# Patient Record
Sex: Male | Born: 1970 | Race: Black or African American | Hispanic: No | Smoking: Never smoker
Health system: Southern US, Community
[De-identification: ages and names within clinical notes are randomized; demographics above are authoritative.]

## PROBLEM LIST (undated history)

## (undated) DIAGNOSIS — I10 Essential (primary) hypertension: Secondary | ICD-10-CM

## (undated) HISTORY — PX: CORONARY STENT PLACEMENT: SHX1402

---

## 2004-07-17 ENCOUNTER — Emergency Department (HOSPITAL_COMMUNITY): Admission: EM | Admit: 2004-07-17 | Discharge: 2004-07-17 | Payer: Self-pay | Admitting: Family Medicine

## 2008-08-17 ENCOUNTER — Emergency Department (HOSPITAL_COMMUNITY): Admission: EM | Admit: 2008-08-17 | Discharge: 2008-08-17 | Payer: Self-pay | Admitting: Family Medicine

## 2009-12-22 ENCOUNTER — Emergency Department (HOSPITAL_COMMUNITY): Admission: EM | Admit: 2009-12-22 | Discharge: 2009-12-22 | Payer: Self-pay | Admitting: Emergency Medicine

## 2010-06-06 ENCOUNTER — Emergency Department (HOSPITAL_COMMUNITY): Admission: EM | Admit: 2010-06-06 | Discharge: 2010-06-06 | Payer: Self-pay | Admitting: Family Medicine

## 2010-11-06 LAB — POCT I-STAT, CHEM 8
HCT: 45 % (ref 39.0–52.0)
Hemoglobin: 15.3 g/dL (ref 13.0–17.0)
Potassium: 3.9 mEq/L (ref 3.5–5.1)
Sodium: 143 mEq/L (ref 135–145)
TCO2: 30 mmol/L (ref 0–100)

## 2011-01-07 ENCOUNTER — Inpatient Hospital Stay (INDEPENDENT_AMBULATORY_CARE_PROVIDER_SITE_OTHER)
Admission: RE | Admit: 2011-01-07 | Discharge: 2011-01-07 | Disposition: A | Discharge status: Home / Self Care | Payer: Self-pay | Source: Ambulatory Visit | Attending: Family Medicine | Admitting: Family Medicine

## 2011-01-07 ENCOUNTER — Ambulatory Visit (INDEPENDENT_AMBULATORY_CARE_PROVIDER_SITE_OTHER): Payer: Self-pay

## 2011-01-07 DIAGNOSIS — R109 Unspecified abdominal pain: Secondary | ICD-10-CM

## 2011-01-07 DIAGNOSIS — I10 Essential (primary) hypertension: Secondary | ICD-10-CM

## 2011-01-07 LAB — POCT I-STAT, CHEM 8
Glucose, Bld: 89 mg/dL (ref 70–99)
HCT: 42 % (ref 39.0–52.0)
Hemoglobin: 14.3 g/dL (ref 13.0–17.0)
Potassium: 4.1 mEq/L (ref 3.5–5.1)
Sodium: 142 mEq/L (ref 135–145)

## 2011-01-07 LAB — CBC
Hemoglobin: 13 g/dL (ref 13.0–17.0)
MCHC: 33.2 g/dL (ref 30.0–36.0)
WBC: 9.2 10*3/uL (ref 4.0–10.5)

## 2011-07-31 ENCOUNTER — Other Ambulatory Visit: Payer: Self-pay

## 2011-07-31 ENCOUNTER — Emergency Department (HOSPITAL_COMMUNITY)
Admission: EM | Admit: 2011-07-31 | Discharge: 2011-07-31 | Disposition: A | Discharge status: Home / Self Care | Payer: Self-pay | Source: Home / Self Care | Attending: Emergency Medicine | Admitting: Emergency Medicine

## 2011-07-31 ENCOUNTER — Emergency Department (INDEPENDENT_AMBULATORY_CARE_PROVIDER_SITE_OTHER): Payer: Self-pay

## 2011-07-31 DIAGNOSIS — R6889 Other general symptoms and signs: Secondary | ICD-10-CM

## 2011-07-31 HISTORY — DX: Essential (primary) hypertension: I10

## 2011-07-31 LAB — CBC
Hemoglobin: 13.2 g/dL (ref 13.0–17.0)
MCV: 88.5 fL (ref 78.0–100.0)
Platelets: 350 10*3/uL (ref 150–400)
RBC: 4.42 MIL/uL (ref 4.22–5.81)
WBC: 11.4 10*3/uL — ABNORMAL HIGH (ref 4.0–10.5)

## 2011-07-31 LAB — DIFFERENTIAL
Eosinophils Relative: 0 % (ref 0–5)
Lymphocytes Relative: 8 % — ABNORMAL LOW (ref 12–46)
Lymphs Abs: 0.9 10*3/uL (ref 0.7–4.0)
Neutrophils Relative %: 85 % — ABNORMAL HIGH (ref 43–77)

## 2011-07-31 MED ORDER — IBUPROFEN 800 MG PO TABS
800.0000 mg | ORAL_TABLET | Freq: Once | ORAL | Status: AC
  Administered 2011-07-31: 800 mg via ORAL

## 2011-07-31 MED ORDER — CLONIDINE HCL 0.1 MG PO TABS
ORAL_TABLET | ORAL | Status: AC
  Filled 2011-07-31: qty 2

## 2011-07-31 MED ORDER — ACETAMINOPHEN 325 MG PO TABS
ORAL_TABLET | ORAL | Status: AC
  Filled 2011-07-31: qty 3

## 2011-07-31 MED ORDER — CLONIDINE HCL 0.1 MG PO TABS
0.2000 mg | ORAL_TABLET | Freq: Once | ORAL | Status: AC
  Administered 2011-07-31: 0.2 mg via ORAL

## 2011-07-31 MED ORDER — IBUPROFEN 800 MG PO TABS
ORAL_TABLET | ORAL | Status: AC
  Filled 2011-07-31: qty 1

## 2011-07-31 MED ORDER — ACETAMINOPHEN 325 MG PO TABS
975.0000 mg | ORAL_TABLET | Freq: Once | ORAL | Status: AC
  Administered 2011-07-31: 975 mg via ORAL

## 2011-07-31 MED ORDER — OSELTAMIVIR PHOSPHATE 75 MG PO CAPS: 75.0000 mg | ORAL_CAPSULE | Freq: Two times a day (BID) | ORAL | Status: AC

## 2011-07-31 MED ORDER — ACETAMINOPHEN-CODEINE #3 300-30 MG PO TABS: 1.0000 | ORAL_TABLET | Freq: Four times a day (QID) | ORAL | Status: AC | PRN

## 2011-07-31 NOTE — ED Notes (Signed)
C/o HA, chills, fever, light sensitivity , generalized body aches; LD of medication was OTC theraflu ~ 4:30-5 am

## 2011-07-31 NOTE — ED Provider Notes (Signed)
History     CSN: [id number] Arrival date & time: 07/31/2011  3:54 PM   First MD Initiated Contact with Patient 07/31/11 [id number]      Chief Complaint  Patient presents with  . Dizziness    (Consider location/radiation/quality/duration/timing/severity/associated sxs/prior treatment) HPI Comments: Started last night with a splitting HA, and feeling dizzy, generalized body aches and cough, some congestion, the headache its not getting better" NO SOB No paresthesias No weakness No numbness  Patient is a 40 y.o. male presenting with fever. The history is provided by the patient.  Fever Primary symptoms of the febrile illness include fever, headaches, cough and arthralgias. Primary symptoms do not include nausea or vomiting. The current episode started yesterday. This is a new problem. The problem has been gradually worsening.    Past Medical History  Diagnosis Date  . Hypertension     History reviewed. No pertinent past surgical history.  History reviewed. No pertinent family history.  History  Substance Use Topics  . Smoking status: Never Smoker   . Smokeless tobacco: Not on file  . Alcohol Use: Yes      Review of Systems  Constitutional: Positive for fever.  HENT: Positive for postnasal drip and sinus pressure.   Respiratory: Positive for cough.   Gastrointestinal: Negative for nausea and vomiting.  Musculoskeletal: Positive for arthralgias.  Neurological: Positive for headaches. Negative for numbness.    Allergies  Review of patient's allergies indicates no known allergies.  Home Medications   Current Outpatient Rx  Name Route Sig Dispense Refill  . ACETAMINOPHEN-CODEINE #3 300-30 MG PO TABS Oral Take 1-2 tablets by mouth every 6 (six) hours as needed for pain. 15 tablet 0  . OSELTAMIVIR PHOSPHATE 75 MG PO CAPS Oral Take 1 capsule (75 mg total) by mouth every 12 (twelve) hours. 10 capsule 0  . OSELTAMIVIR PHOSPHATE 75 MG PO CAPS Oral Take 1 capsule (75 mg total)  by mouth every 12 (twelve) hours. 10 capsule 0    BP 176/88  Pulse 111  Temp(Src) 100.2 F (37.9 C) (Oral)  Resp 18  SpO2 97%  Physical Exam  Nursing note and vitals reviewed. Constitutional: He is oriented to person, place, and time. He appears well-developed.  Non-toxic appearance. No distress.  HENT:  Right Ear: Tympanic membrane normal.  Left Ear: Tympanic membrane normal.  Nose: Rhinorrhea present.  Mouth/Throat: Uvula is midline and mucous membranes are normal. Posterior oropharyngeal erythema present. No oropharyngeal exudate or tonsillar abscesses.  Neck: Normal range of motion. Neck supple. No JVD present. No tracheal tenderness present. Normal range of motion present. No Kernig's sign noted. No thyromegaly present.  Cardiovascular: Normal heart sounds and normal pulses.  Tachycardia present.   Pulmonary/Chest: Effort normal and breath sounds normal. No respiratory distress. He has no decreased breath sounds. He has no wheezes. He has no rhonchi. He has no rales.  Abdominal: Normal appearance. There is no tenderness.  Musculoskeletal: Normal range of motion.  Neurological: He is alert and oriented to person, place, and time. He has normal strength. No cranial nerve deficit or sensory deficit. Coordination normal.  Skin: Skin is warm.    ED Course  Procedures (including critical care time)  Labs Reviewed  CBC - Abnormal; Notable for the following:    WBC 11.4 (*)    All other components within normal limits  DIFFERENTIAL - Abnormal; Notable for the following:    Neutrophils Relative 85 (*)    Neutro Abs 9.7 (*)    Lymphocytes Relative  8 (*)    All other components within normal limits   Dg Chest 2 View  07/31/2011  *RADIOLOGY REPORT*  Clinical Data: Cough and fever.  CHEST - 2 VIEW  Comparison: Chest x-ray 08/17/2008.  Findings: The cardiac silhouette, mediastinal and hilar contours are within normal limits and stable.   There is mild peribronchial thickening and  streaky areas of atelectasis suggesting bronchitis. No definite infiltrates or effusions.  The bony thorax is intact.  IMPRESSION: Findings suggest bronchitis.  No definite infiltrates.  Original Report Authenticated By: P. Loralie Champagne, M.D.     1. Influenza-like illness       MDM  URI cough and chills- fever. Hypertensive- mild leukocytosis. X-rays consistent with a respiratory viral syndrome.        Jimmie Molly, MD 07/31/11 2128

## 2012-06-07 IMAGING — CR DG ABDOMEN 1V
3 series · 3 of 3 positions shown · non-contrast
Comparison: None.

CLINICAL DATA: Epigastric abdominal pain.  Nausea.

ABDOMEN - 1 VIEW 01/07/2011:

[view not recorded (1 of 3)]
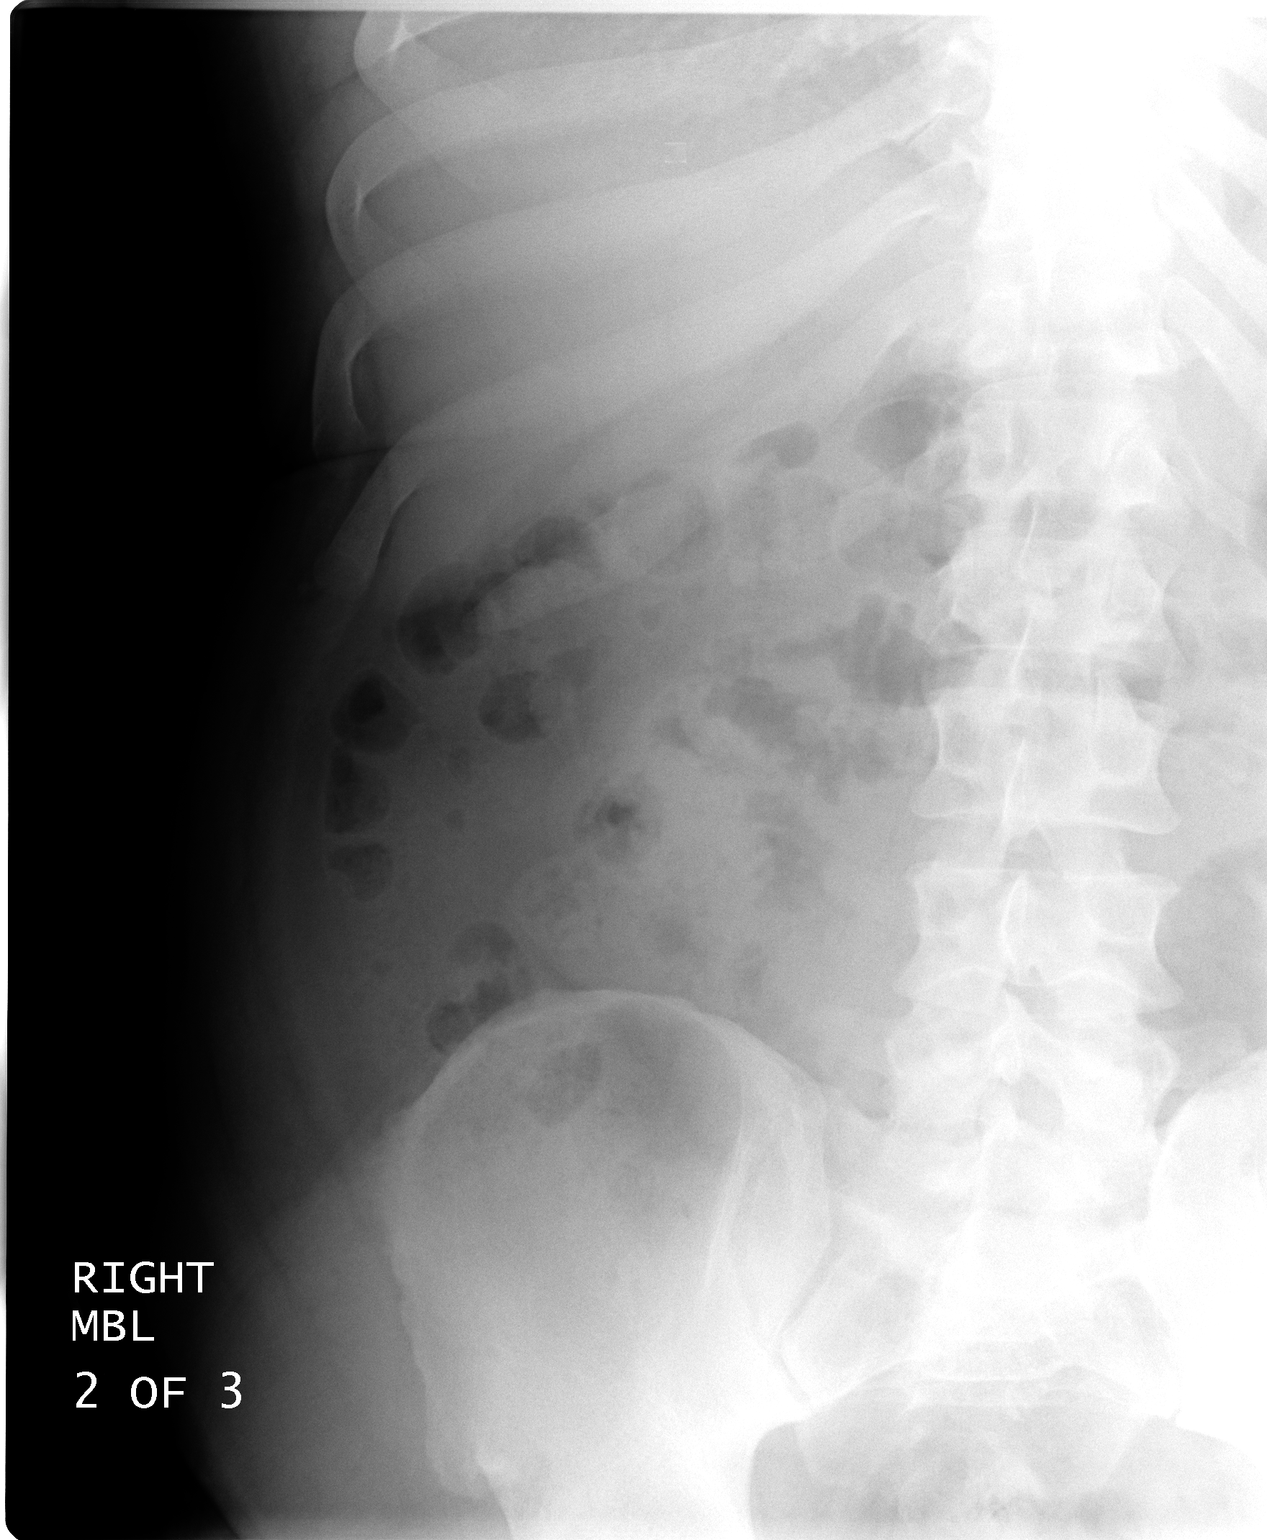

[view not recorded (2 of 3)]
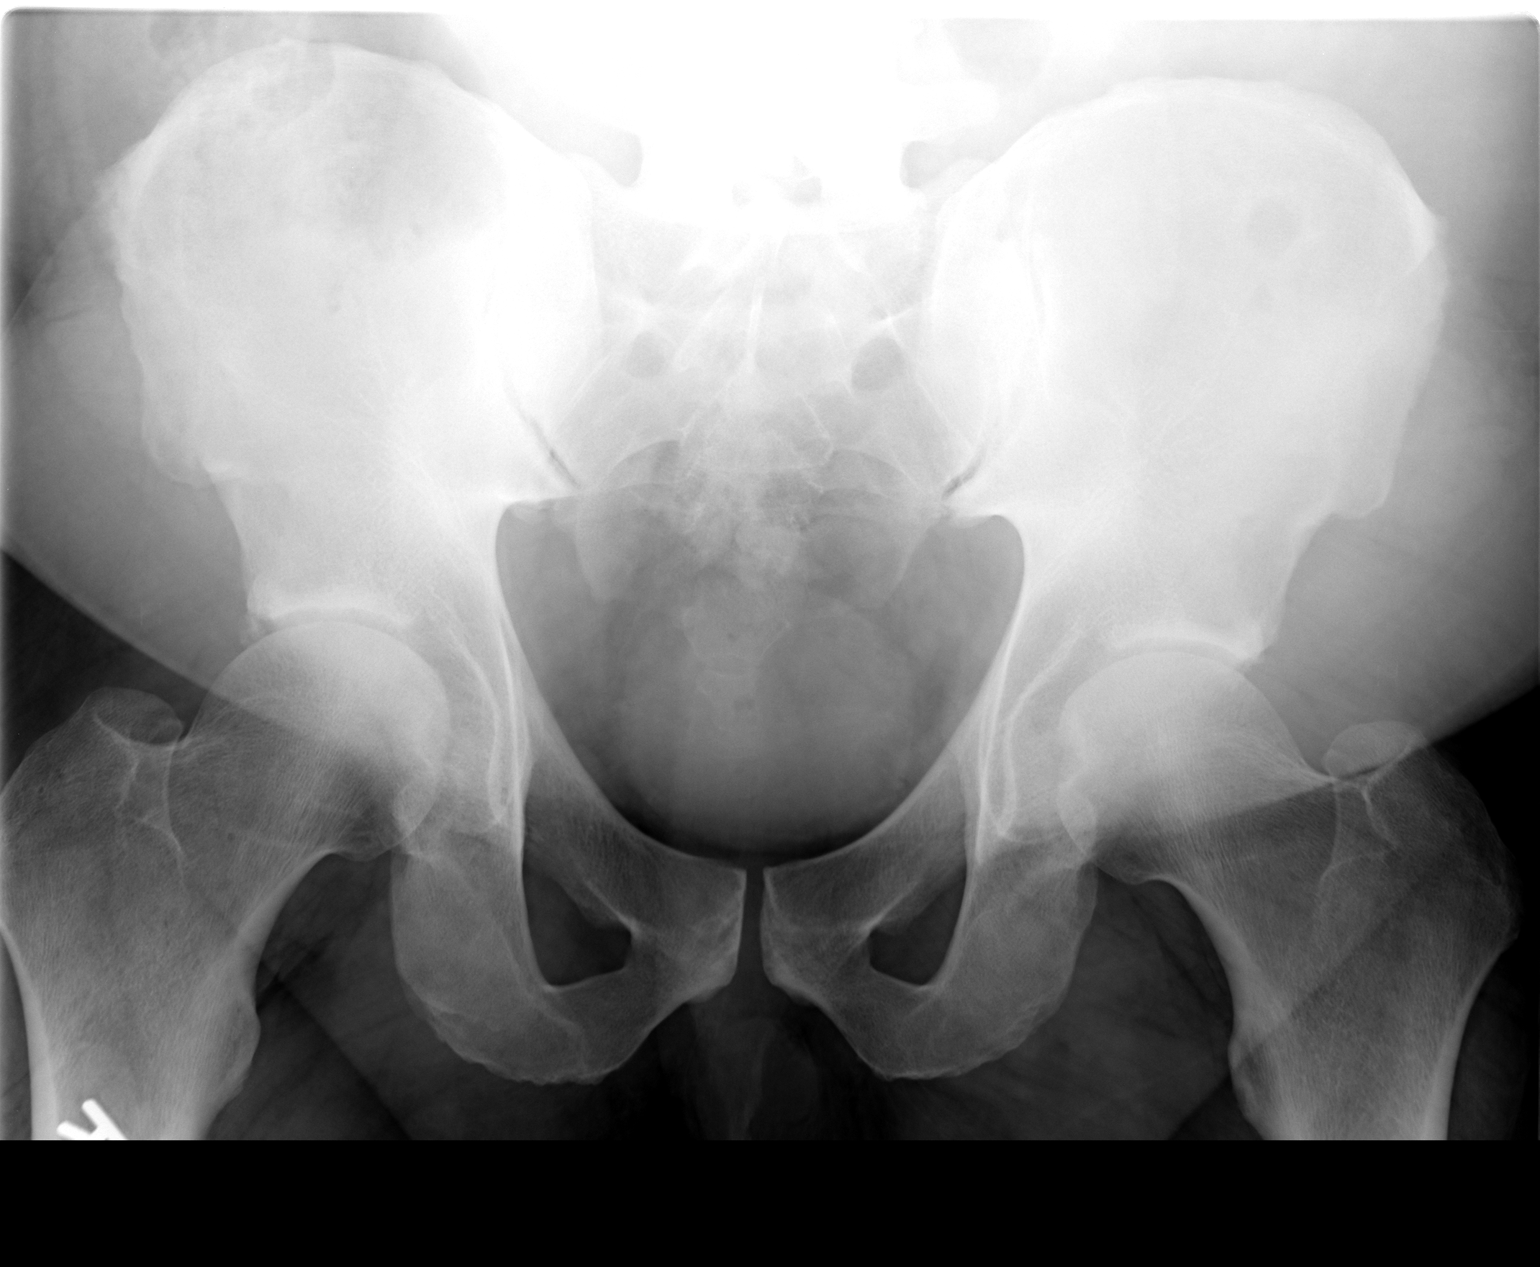

[view not recorded (3 of 3)]
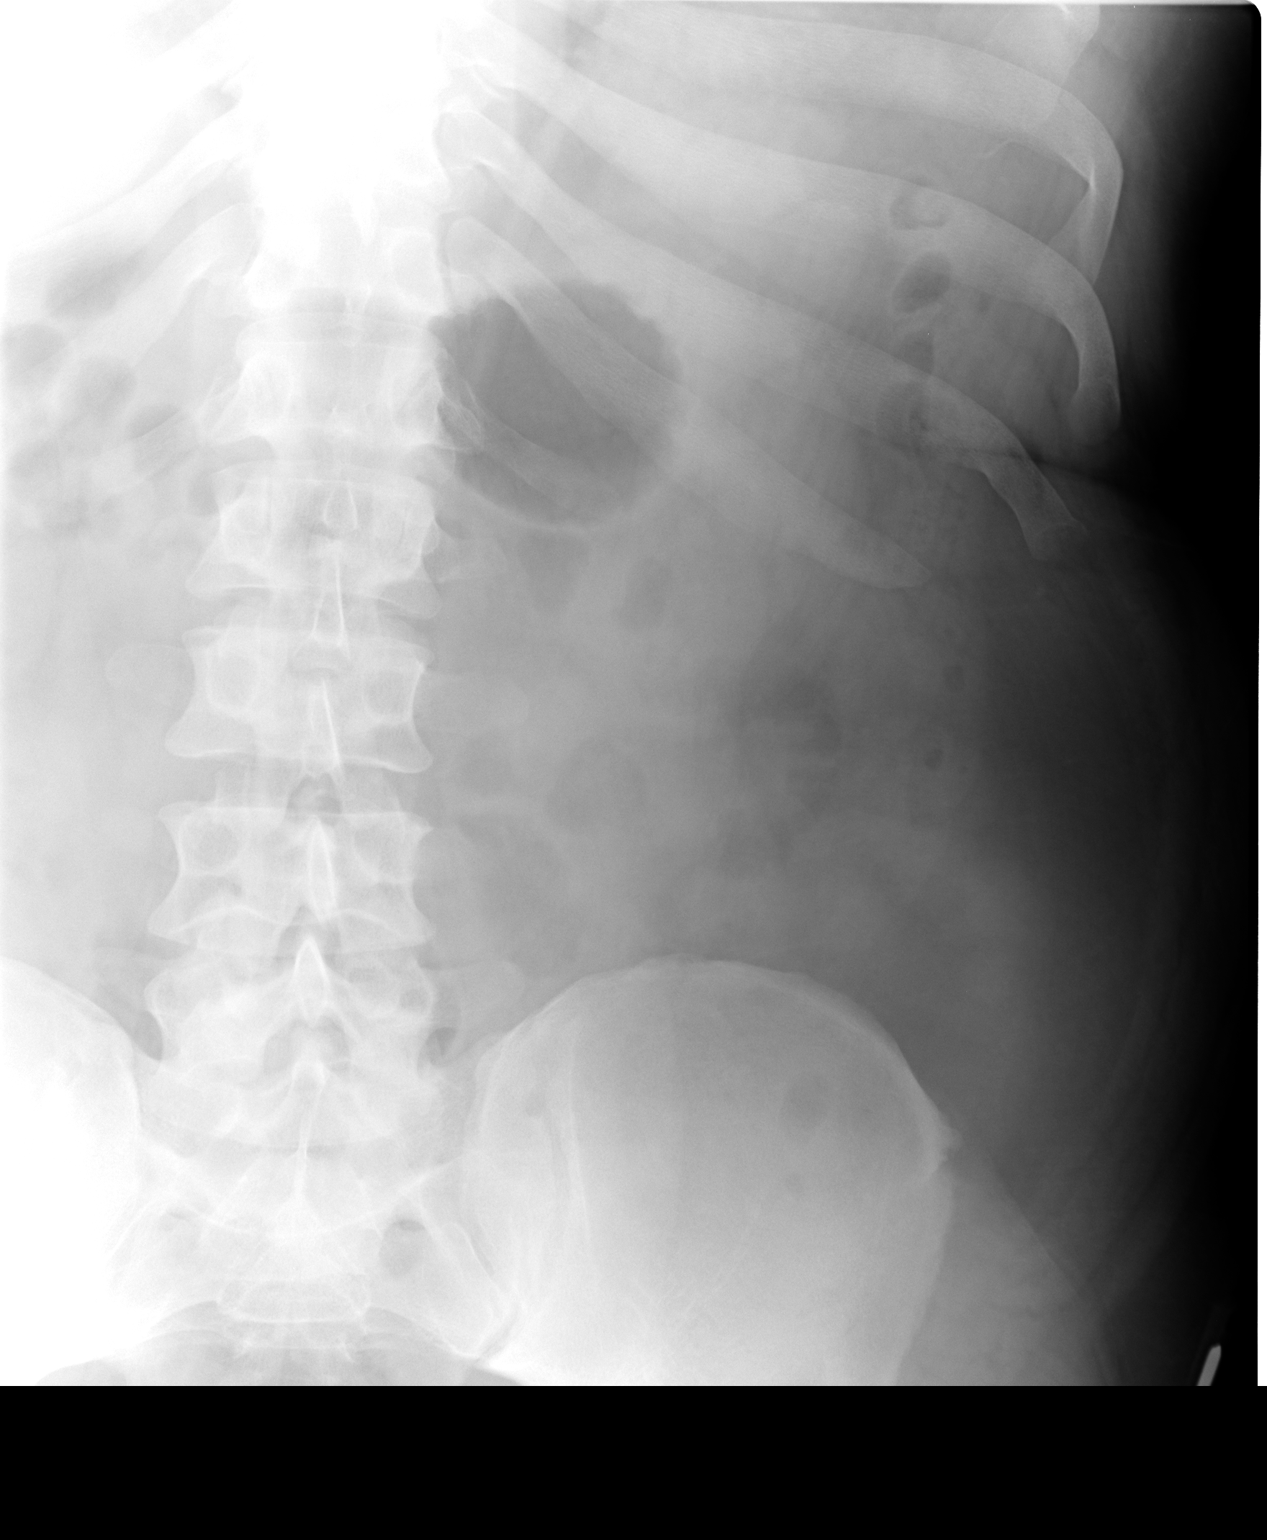

[3 of 3 positions shown; findings below may reference images not displayed]

FINDINGS: Bowel gas pattern unremarkable without evidence of
obstruction or significant ileus.  Gas within normal caliber small
bowel loops.  Gas and expected amount of stool throughout normal
caliber colon.  No abnormal calcifications.  Regional skeleton
unremarkable.
IMPRESSION: No acute abdominal abnormality.

## 2012-12-29 IMAGING — CR DG CHEST 2V
2 series · 2 of 2 positions shown · non-contrast
Comparison: Chest x-ray 08/17/2008.

CLINICAL DATA: Cough and fever.

CHEST - 2 VIEW

[view not recorded (1 of 2)]
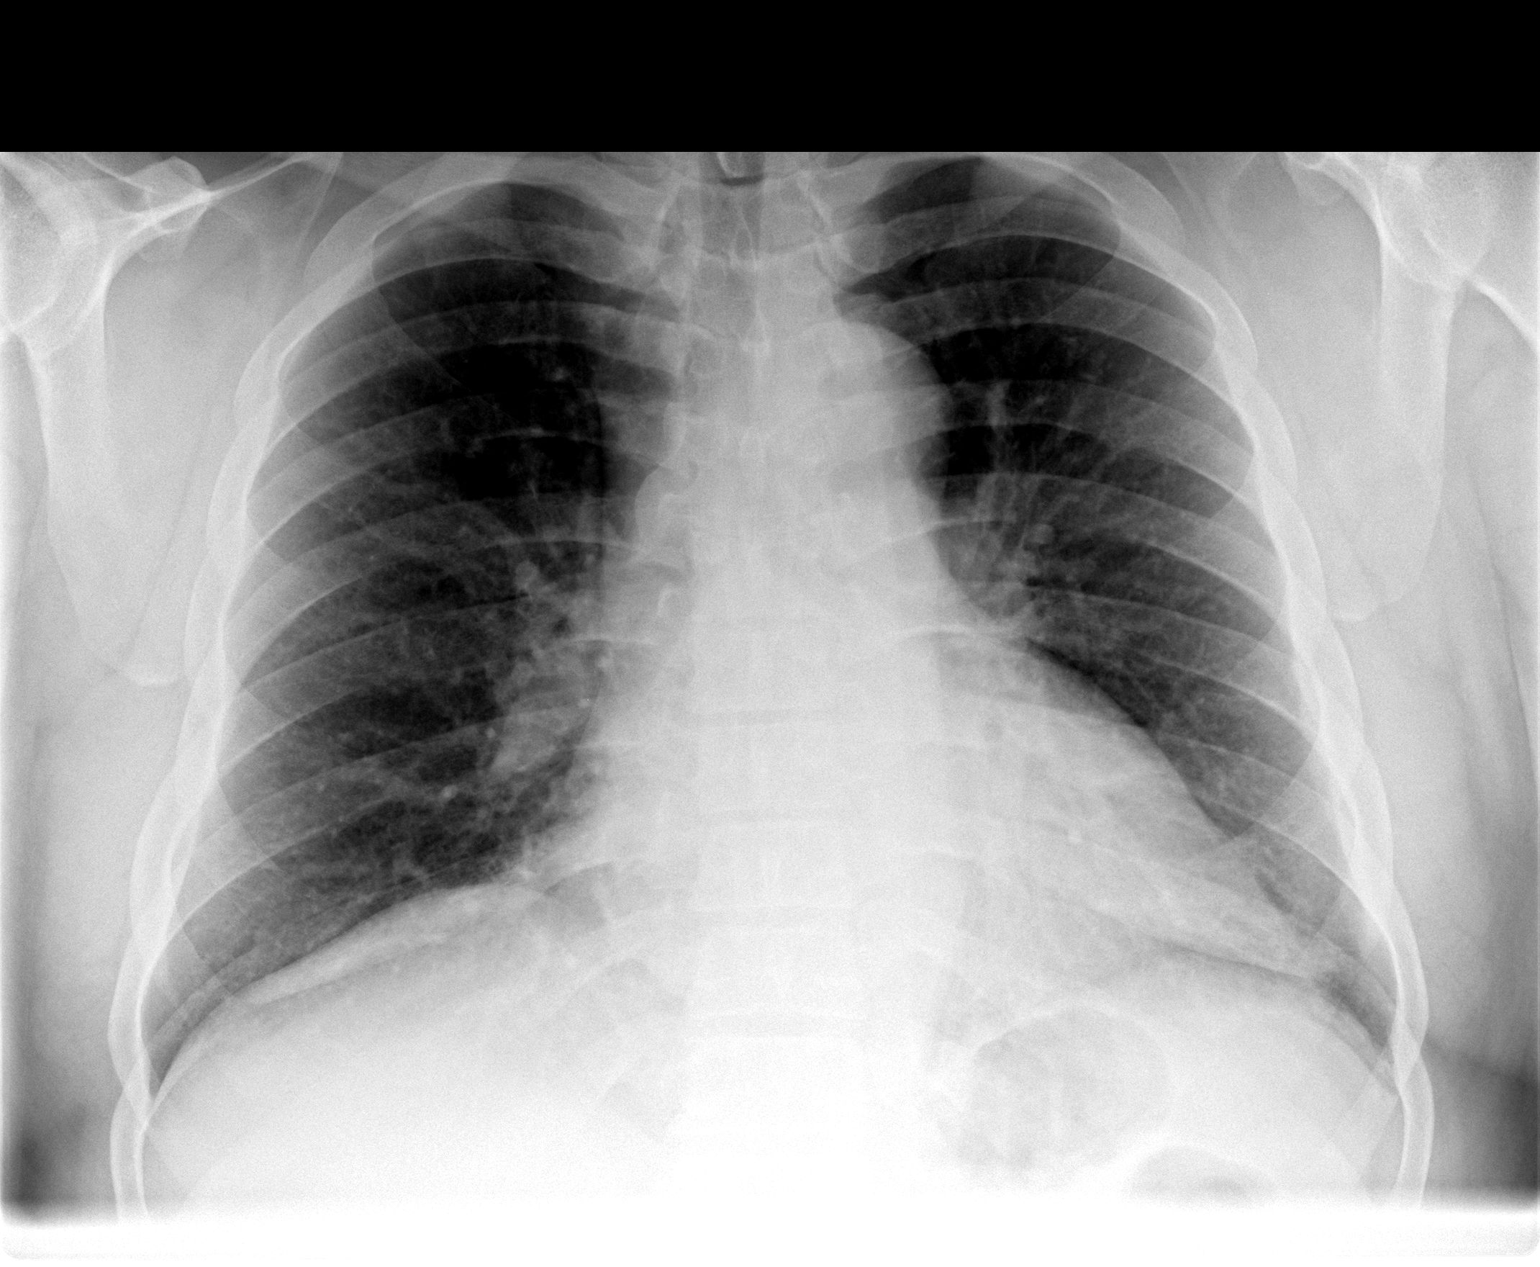

[view not recorded (2 of 2)]
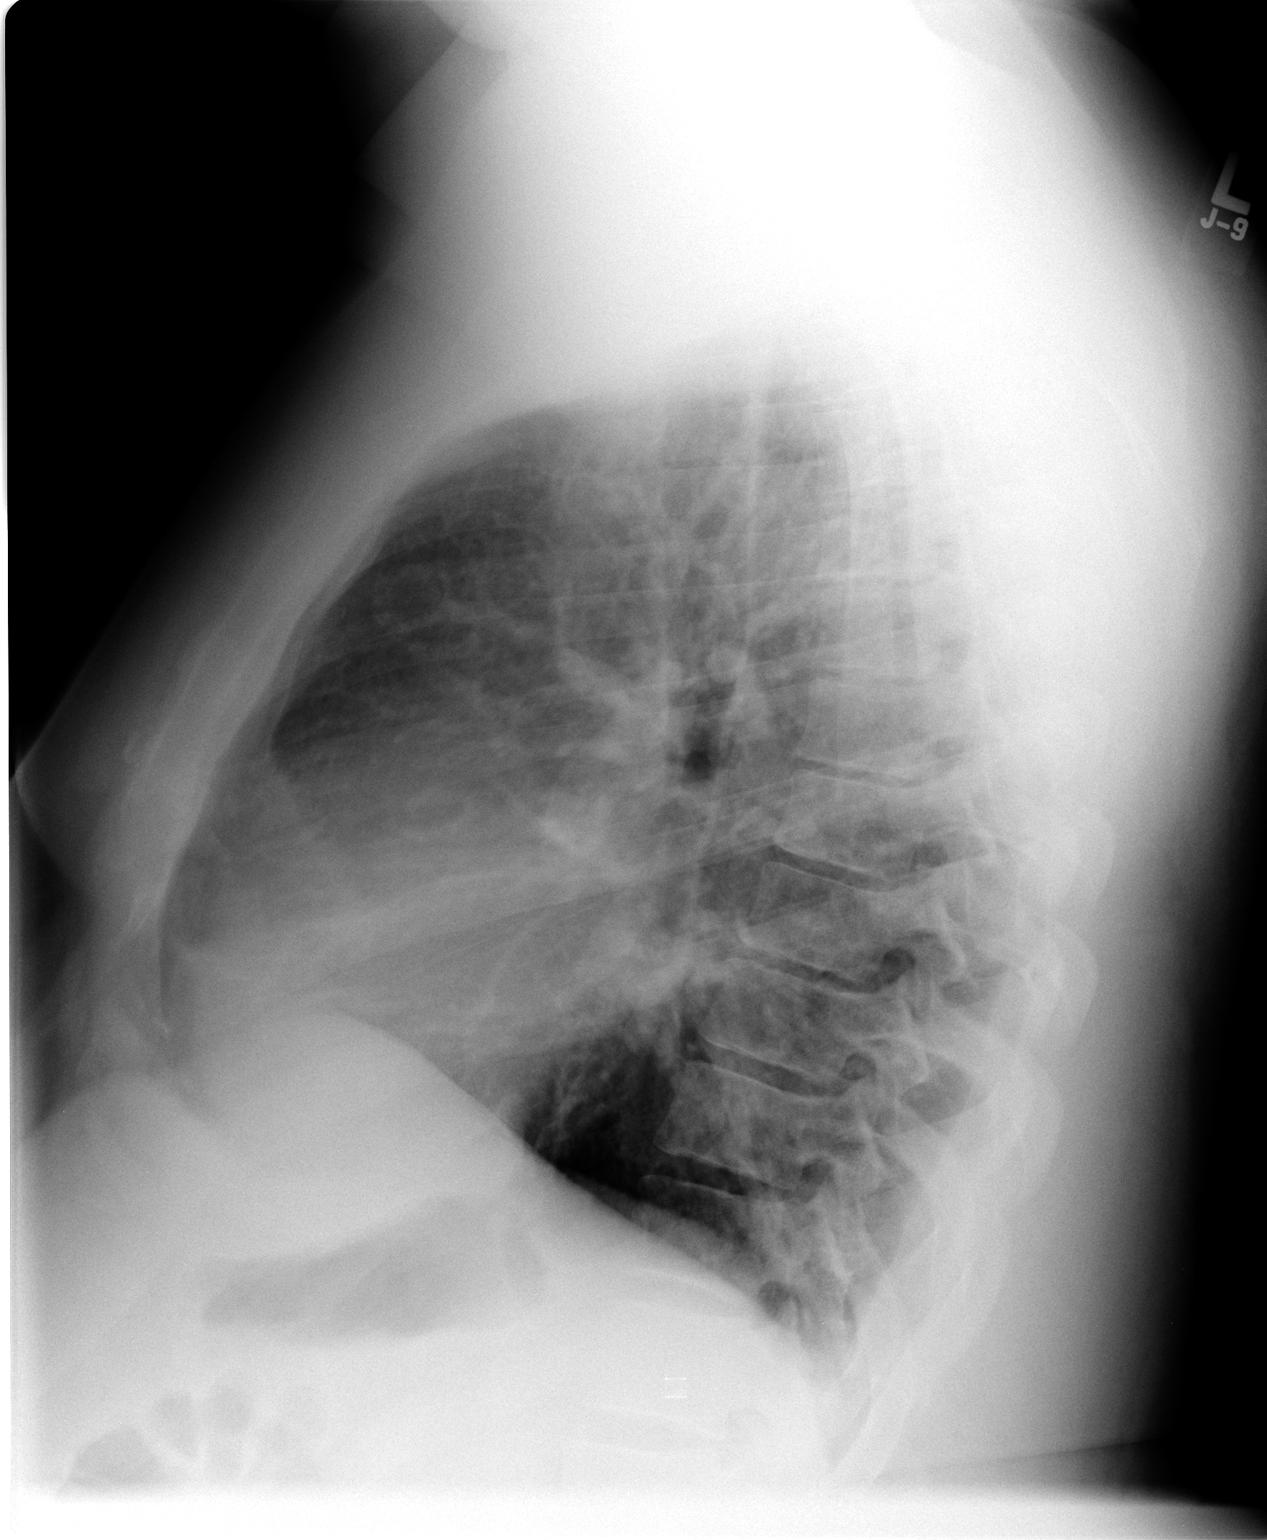

[2 of 2 positions shown; findings below may reference images not displayed]

FINDINGS: The cardiac silhouette, mediastinal and hilar contours
are within normal limits and stable.   There is mild peribronchial
thickening and streaky areas of atelectasis suggesting bronchitis.
No definite infiltrates or effusions.  The bony thorax is intact.
IMPRESSION: Findings suggest bronchitis.  No definite infiltrates.

## 2013-11-03 ENCOUNTER — Ambulatory Visit: Payer: Self-pay | Admitting: Physician Assistant

## 2014-02-01 ENCOUNTER — Other Ambulatory Visit: Payer: Self-pay | Admitting: Family Medicine

## 2014-02-01 DIAGNOSIS — M542 Cervicalgia: Secondary | ICD-10-CM

## 2014-02-06 ENCOUNTER — Other Ambulatory Visit: Payer: Self-pay

## 2014-02-06 ENCOUNTER — Ambulatory Visit
Admission: RE | Admit: 2014-02-06 | Discharge: 2014-02-06 | Disposition: A | Discharge status: Home / Self Care | Payer: 59 | Source: Ambulatory Visit | Attending: Family Medicine | Admitting: Family Medicine

## 2014-02-06 DIAGNOSIS — M542 Cervicalgia: Secondary | ICD-10-CM

## 2016-07-17 DIAGNOSIS — I214 Non-ST elevation (NSTEMI) myocardial infarction: Secondary | ICD-10-CM | POA: Insufficient documentation

## 2017-06-10 DIAGNOSIS — I5022 Chronic systolic (congestive) heart failure: Secondary | ICD-10-CM | POA: Insufficient documentation

## 2017-06-10 DIAGNOSIS — E785 Hyperlipidemia, unspecified: Secondary | ICD-10-CM | POA: Insufficient documentation

## 2017-06-10 DIAGNOSIS — I255 Ischemic cardiomyopathy: Secondary | ICD-10-CM | POA: Insufficient documentation

## 2017-06-10 DIAGNOSIS — I420 Dilated cardiomyopathy: Secondary | ICD-10-CM | POA: Insufficient documentation

## 2020-05-05 ENCOUNTER — Other Ambulatory Visit: Payer: Self-pay

## 2020-05-05 ENCOUNTER — Emergency Department (HOSPITAL_BASED_OUTPATIENT_CLINIC_OR_DEPARTMENT_OTHER)
Admission: EM | Admit: 2020-05-05 | Discharge: 2020-05-05 | Disposition: A | Discharge status: Home / Self Care | Payer: BC Managed Care – PPO

## 2021-01-04 ENCOUNTER — Other Ambulatory Visit: Payer: Self-pay

## 2021-01-04 ENCOUNTER — Encounter (HOSPITAL_COMMUNITY): Payer: Self-pay | Admitting: Pharmacy Technician

## 2021-01-04 ENCOUNTER — Inpatient Hospital Stay (HOSPITAL_COMMUNITY)
Admission: EM | Admit: 2021-01-04 | Discharge: 2021-01-08 | DRG: 291 | Disposition: A | Discharge status: Home / Self Care | Payer: BC Managed Care – PPO | Attending: Internal Medicine | Admitting: Internal Medicine

## 2021-01-04 ENCOUNTER — Emergency Department (HOSPITAL_COMMUNITY): Payer: BC Managed Care – PPO

## 2021-01-04 DIAGNOSIS — I255 Ischemic cardiomyopathy: Secondary | ICD-10-CM | POA: Diagnosis present

## 2021-01-04 DIAGNOSIS — R7303 Prediabetes: Secondary | ICD-10-CM | POA: Diagnosis present

## 2021-01-04 DIAGNOSIS — I509 Heart failure, unspecified: Secondary | ICD-10-CM

## 2021-01-04 DIAGNOSIS — I252 Old myocardial infarction: Secondary | ICD-10-CM

## 2021-01-04 DIAGNOSIS — Z8249 Family history of ischemic heart disease and other diseases of the circulatory system: Secondary | ICD-10-CM | POA: Diagnosis not present

## 2021-01-04 DIAGNOSIS — I5041 Acute combined systolic (congestive) and diastolic (congestive) heart failure: Secondary | ICD-10-CM

## 2021-01-04 DIAGNOSIS — E7800 Pure hypercholesterolemia, unspecified: Secondary | ICD-10-CM | POA: Diagnosis present

## 2021-01-04 DIAGNOSIS — Z955 Presence of coronary angioplasty implant and graft: Secondary | ICD-10-CM

## 2021-01-04 DIAGNOSIS — I5023 Acute on chronic systolic (congestive) heart failure: Secondary | ICD-10-CM | POA: Diagnosis not present

## 2021-01-04 DIAGNOSIS — I110 Hypertensive heart disease with heart failure: Secondary | ICD-10-CM | POA: Diagnosis present

## 2021-01-04 DIAGNOSIS — Z20822 Contact with and (suspected) exposure to covid-19: Secondary | ICD-10-CM | POA: Diagnosis present

## 2021-01-04 DIAGNOSIS — I160 Hypertensive urgency: Secondary | ICD-10-CM | POA: Diagnosis not present

## 2021-01-04 DIAGNOSIS — I161 Hypertensive emergency: Secondary | ICD-10-CM | POA: Diagnosis present

## 2021-01-04 DIAGNOSIS — T465X6A Underdosing of other antihypertensive drugs, initial encounter: Secondary | ICD-10-CM | POA: Diagnosis present

## 2021-01-04 DIAGNOSIS — I2510 Atherosclerotic heart disease of native coronary artery without angina pectoris: Secondary | ICD-10-CM | POA: Diagnosis present

## 2021-01-04 DIAGNOSIS — I10 Essential (primary) hypertension: Secondary | ICD-10-CM

## 2021-01-04 DIAGNOSIS — I5043 Acute on chronic combined systolic (congestive) and diastolic (congestive) heart failure: Secondary | ICD-10-CM | POA: Diagnosis present

## 2021-01-04 DIAGNOSIS — E876 Hypokalemia: Secondary | ICD-10-CM | POA: Diagnosis present

## 2021-01-04 DIAGNOSIS — I248 Other forms of acute ischemic heart disease: Secondary | ICD-10-CM | POA: Diagnosis present

## 2021-01-04 DIAGNOSIS — E785 Hyperlipidemia, unspecified: Secondary | ICD-10-CM | POA: Diagnosis present

## 2021-01-04 DIAGNOSIS — T502X6A Underdosing of carbonic-anhydrase inhibitors, benzothiadiazides and other diuretics, initial encounter: Secondary | ICD-10-CM | POA: Diagnosis present

## 2021-01-04 LAB — BASIC METABOLIC PANEL
Anion gap: 6 (ref 5–15)
BUN: 10 mg/dL (ref 6–20)
CO2: 28 mmol/L (ref 22–32)
Calcium: 8.5 mg/dL — ABNORMAL LOW (ref 8.9–10.3)
Chloride: 105 mmol/L (ref 98–111)
Creatinine, Ser: 1.17 mg/dL (ref 0.61–1.24)
GFR, Estimated: >60 mL/min (ref >60)
Glucose, Bld: 93 mg/dL (ref 70–99)
Potassium: 3.4 mmol/L — ABNORMAL LOW (ref 3.5–5.1)
Sodium: 139 mmol/L (ref 135–145)

## 2021-01-04 LAB — CBC
HCT: 43.6 % (ref 39.0–52.0)
Hemoglobin: 14.2 g/dL (ref 13.0–17.0)
MCH: 30.2 pg (ref 26.0–34.0)
MCHC: 32.6 g/dL (ref 30.0–36.0)
MCV: 92.8 fL (ref 80.0–100.0)
Platelets: 362 10*3/uL (ref 150–400)
RBC: 4.7 MIL/uL (ref 4.22–5.81)
RDW: 13.2 % (ref 11.5–15.5)
WBC: 9.5 10*3/uL (ref 4.0–10.5)
nRBC: 0 % (ref 0.0–0.2)

## 2021-01-04 LAB — RESP PANEL BY RT-PCR (FLU A&B, COVID) ARPGX2
Influenza A by PCR: NEGATIVE
Influenza B by PCR: NEGATIVE
SARS Coronavirus 2 by RT PCR: NEGATIVE

## 2021-01-04 LAB — BRAIN NATRIURETIC PEPTIDE: B Natriuretic Peptide: 707.6 pg/mL — ABNORMAL HIGH (ref 0.0–100.0)

## 2021-01-04 LAB — TROPONIN I (HIGH SENSITIVITY)
Troponin I (High Sensitivity): 72 ng/L — ABNORMAL HIGH (ref <18)
Troponin I (High Sensitivity): 66 ng/L — ABNORMAL HIGH (ref <18)

## 2021-01-04 MED ORDER — ENOXAPARIN SODIUM 80 MG/0.8ML IJ SOSY
70.0000 mg | PREFILLED_SYRINGE | INTRAMUSCULAR | Status: DC
  Administered 2021-01-04 – 2021-01-07 (×4): 70 mg via SUBCUTANEOUS
  Filled 2021-01-04: qty 0.7
  Filled 2021-01-04 (×3): qty 0.8

## 2021-01-04 MED ORDER — LOSARTAN POTASSIUM 50 MG PO TABS
100.0000 mg | ORAL_TABLET | Freq: Every day | ORAL | Status: DC
  Administered 2021-01-04 – 2021-01-05 (×2): 100 mg via ORAL
  Filled 2021-01-04 (×2): qty 2

## 2021-01-04 MED ORDER — NITROGLYCERIN 2 % TD OINT
1.0000 [in_us] | TOPICAL_OINTMENT | Freq: Four times a day (QID) | TRANSDERMAL | Status: DC
  Administered 2021-01-04 – 2021-01-05 (×4): 1 [in_us] via TOPICAL
  Filled 2021-01-04 (×2): qty 1
  Filled 2021-01-04: qty 30

## 2021-01-04 MED ORDER — FUROSEMIDE 10 MG/ML IJ SOLN
40.0000 mg | Freq: Once | INTRAMUSCULAR | Status: AC
  Administered 2021-01-04: 40 mg via INTRAVENOUS
  Filled 2021-01-04: qty 4

## 2021-01-04 MED ORDER — ONDANSETRON HCL 4 MG/2ML IJ SOLN: 4.0000 mg | Freq: Four times a day (QID) | INTRAMUSCULAR | Status: DC | PRN

## 2021-01-04 MED ORDER — SODIUM CHLORIDE 0.9% FLUSH
3.0000 mL | INTRAVENOUS | Status: DC | PRN
  Administered 2021-01-06: 3 mL via INTRAVENOUS

## 2021-01-04 MED ORDER — FUROSEMIDE 10 MG/ML IJ SOLN
40.0000 mg | Freq: Two times a day (BID) | INTRAMUSCULAR | Status: DC
  Administered 2021-01-04 – 2021-01-06 (×3): 40 mg via INTRAVENOUS
  Filled 2021-01-04 (×3): qty 4

## 2021-01-04 MED ORDER — AMLODIPINE BESYLATE 10 MG PO TABS
10.0000 mg | ORAL_TABLET | Freq: Every day | ORAL | Status: DC
  Administered 2021-01-04 – 2021-01-05 (×2): 10 mg via ORAL
  Filled 2021-01-04: qty 1
  Filled 2021-01-04: qty 2

## 2021-01-04 MED ORDER — METOPROLOL TARTRATE 5 MG/5ML IV SOLN
5.0000 mg | Freq: Once | INTRAVENOUS | Status: AC
  Administered 2021-01-04: 5 mg via INTRAVENOUS
  Filled 2021-01-04: qty 5

## 2021-01-04 MED ORDER — SODIUM CHLORIDE 0.9% FLUSH
3.0000 mL | Freq: Two times a day (BID) | INTRAVENOUS | Status: DC
  Administered 2021-01-04 – 2021-01-08 (×7): 3 mL via INTRAVENOUS

## 2021-01-04 MED ORDER — LABETALOL HCL 5 MG/ML IV SOLN
20.0000 mg | Freq: Once | INTRAVENOUS | Status: AC
  Administered 2021-01-04: 20 mg via INTRAVENOUS
  Filled 2021-01-04: qty 4

## 2021-01-04 MED ORDER — SODIUM CHLORIDE 0.9 % IV SOLN: 250.0000 mL | INTRAVENOUS | Status: DC | PRN

## 2021-01-04 MED ORDER — ACETAMINOPHEN 325 MG PO TABS
650.0000 mg | ORAL_TABLET | ORAL | Status: DC | PRN
  Administered 2021-01-04 – 2021-01-05 (×2): 650 mg via ORAL
  Filled 2021-01-04 (×2): qty 2

## 2021-01-04 MED ORDER — POTASSIUM CHLORIDE CRYS ER 20 MEQ PO TBCR
40.0000 meq | EXTENDED_RELEASE_TABLET | Freq: Two times a day (BID) | ORAL | Status: DC
  Administered 2021-01-04 – 2021-01-07 (×6): 40 meq via ORAL
  Filled 2021-01-04 (×4): qty 2
  Filled 2021-01-04: qty 4
  Filled 2021-01-04: qty 2

## 2021-01-04 NOTE — ED Triage Notes (Signed)
Pt bib ems from work where pt had sudden onset shob. Pt reports this has been happening X2 days. Pt with hx MI X2. Pt hypertensive in triage, states he has been out of his medications since December.

## 2021-01-04 NOTE — H&P (Signed)
[facility] History and Physical  [name] XAJ:[id number] DOB: 1971-03-25 DOA: 01/04/2021  Referring physician: ED  PCP: Pcp, No   Patient is coming from: Home  Chief Complaint: Shortness of breath  HPI: Scott Dunlap is a 50 y.o. male with past medical history of hypertension noncompliant with medication presented to hospital with shortness of breath for last 2 to 3 days which got acutely worse at work today.  He used to be on blood pressure medication in the past but had not been taking since that December.  Recently ED noticed some swelling in his legs as well.  He however denied any chest pain.  ED Course: In the ED, patient was noted to have an extremely elevated blood pressure up to 201/142.  A chest x-ray was done in the ED showed pulmonary vascular congestion.  Patient was then considered for admission to the hospital for new onset congestive heart failure and hypertensive urgency.  In the ED, patient received labetalol, Lasix 40 mg and nitroglycerin topical ointment.  BNP was elevated at 707.  Potassium was low at 3.4.  Review of Systems:  All systems were reviewed and were negative unless otherwise mentioned in the HPI  Past Medical History:  Diagnosis Date  . Hypertension Coronary artery diseas    History reviewed. No pertinent surgical history.  Social History:  reports that he has never smoked. He does not have any smokeless tobacco history on file. He reports current alcohol use. He reports that he does not use drugs.  No Known Allergies  Family history: Mother with HTN  Prior to Admission medications   Medication Sig Start Date End Date Taking? Authorizing Provider  hydrochlorothiazide (HYDRODIURIL) 25 MG tablet Take 25 mg by mouth daily. 08/04/20  Yes [provider]  losartan (COZAAR) 100 MG tablet Take 100 mg by mouth daily. 08/04/20  Yes [provider]    Physical Exam: Vitals:   01/04/21 1615 01/04/21 1630 01/04/21 1645  01/04/21 1715  BP: (!) 176/145 (!) 181/130 (!) 182/128 (!) 179/120  Pulse: 82 76 78 79  Resp: (!) 39 (!) 22 (!) 30 (!) 29  Temp:      TempSrc:      SpO2: 96% 93% 95% 95%   Wt Readings from Last 3 Encounters:  No data found for Wt   There is no height or weight on file to calculate BMI.  General: Alert awake communicative, not in obvious distress HENT: Normocephalic, pupils equally reacting to light and accommodation.  No scleral pallor or icterus noted. Oral mucosa is moist.  Chest:  Clear breath sounds.  Diminished breath sounds bilaterally. No crackles or wheezes.  CVS: S1 &S2 heard. No murmur.  Regular rate and rhythm. Abdomen: Soft, nontender, nondistended.  Bowel sounds are heard.  Liver is not palpable, no abdominal mass palpated Extremities: No cyanosis, clubbing lower extremity pedal edema peripheral pulses are palpable. Psych: Alert, awake and oriented, normal mood CNS:  No cranial nerve deficits.  Power equal in all extremities.   No cerebellar signs.   Skin: Warm and dry.  No rashes noted.  Labs on Admission:   CBC: Recent Labs  Lab 01/04/21 1510  WBC 9.5  HGB 14.2  HCT 43.6  MCV 92.8  PLT 362    Basic Metabolic Panel: Recent Labs  Lab 01/04/21 1510  NA 139  K 3.4*  CL 105  CO2 28  GLUCOSE 93  BUN 10  CREATININE 1.17  CALCIUM 8.5*    Liver Function Tests:  No results for input(s): AST, ALT, ALKPHOS, BILITOT, PROT, ALBUMIN in the last 168 hours. No results for input(s): LIPASE, AMYLASE in the last 168 hours. No results for input(s): AMMONIA in the last 168 hours.  Cardiac Enzymes: No results for input(s): CKTOTAL, CKMB, CKMBINDEX, TROPONINI in the last 168 hours.  BNP (last 3 results) Recent Labs    01/04/21 1510  BNP 707.6*    ProBNP (last 3 results) No results for input(s): PROBNP in the last 8760 hours.  CBG: No results for input(s): GLUCAP in the last 168 hours.  Lipase  No results found for: LIPASE   Urinalysis No results found  for: COLORURINE, APPEARANCEUR, LABSPEC, PHURINE, GLUCOSEU, HGBUR, BILIRUBINUR, KETONESUR, PROTEINUR, UROBILINOGEN, NITRITE, LEUKOCYTESUR   Drugs of Abuse  No results found for: LABOPIA, COCAINSCRNUR, LABBENZ, AMPHETMU, THCU, LABBARB    Radiological Exams on Admission: DG Chest 2 View  Result Date: 01/04/2021 CLINICAL DATA:  Shortness of breath EXAM: CHEST - 2 VIEW COMPARISON:  May 16, 2017 FINDINGS: There is cardiomegaly with pulmonary venous hypertension. There is interstitial edema. There is mild left midlung atelectasis. No consolidation. No adenopathy. No bone lesions. IMPRESSION: Cardiomegaly with pulmonary vascular congestion. Interstitial edema. Appearance indicative of congestive heart failure. No consolidation. Mild left midlung atelectasis. Electronically Signed   By: Bretta Bang III M.D.   On: 01/04/2021 12:30    EKG: Personally reviewed by me which shows normal sinus rhythm with possible LVH.  Assessment/Plan Principal Problem:   Acute CHF (congestive heart failure) (HCC) Active Problems:   Accelerated hypertension   CHF (congestive heart failure) (HCC)  Decompensated congestive heart failure likely secondary to uncontrolled hypertension.  We will admit the patient telemetry monitor, IV diuretics.  Strict intake and output charting.  Follow CHF protocol.  We will get 2D echocardiogram troponins.  Check hemoglobin A1c lipid profile.  Might need cardiology evaluation depending upon clinical course.  Accelerated hypertension on presentation likely secondary to medication noncompliance.  Was on HCTZ and losartan as outpatient.  Will resume losartan.  Will be on IV Lasix as well.  Continue Nitropaste.  We will continue to monitor closely.  Mild hypokalemia.  We will continue potassium supplements while on IV diuretics.   DVT Prophylaxis: Lovenox subcu  Consultant: None  Code Status: Full code  Microbiology none  Antibiotics: None  Family Communication:   Patients' condition and plan of care including tests being ordered have been discussed with the patient  who indicate understanding and agree with the plan.   Status is: Observation  The patient remains OBS appropriate and will d/c before 2 midnights.  Dispo: The patient is from: Home              Anticipated d/c is to: Home             Severity of Illness: The appropriate patient status for this patient is OBSERVATION. Observation status is judged to be reasonable and necessary in order to provide the required intensity of service to ensure the patient's safety. The patient's presenting symptoms, physical exam findings, and initial radiographic and laboratory data in the context of their medical condition is felt to place them at decreased risk for further clinical deterioration. Furthermore, it is anticipated that the patient will be medically stable for discharge from the hospital within 2 midnights of admission. The following factors support the patient status of observation.   Signed, Joycelyn Das, MD [facility] 01/04/2021

## 2021-01-04 NOTE — ED Provider Notes (Signed)
Care transferred to me.  Patient's labs show what is probably heart strain indicative of the troponin of 72 in addition to CHF.  He does seem to have improved and his blood pressure but I think you will need admission for more expedited work-up, blood pressure control, and diuresis.  He does not have a current PCP.  Has not been on meds since December.   Pricilla Loveless, MD 01/04/21 1730

## 2021-01-04 NOTE — ED Notes (Signed)
Pt refused labdraw

## 2021-01-04 NOTE — ED Provider Notes (Signed)
[facility] EMERGENCY DEPARTMENT Provider Note   CSN: [id number] Arrival date & time: 01/04/21  1136     History Chief Complaint  Patient presents with  . Shortness of Breath    Scott Dunlap is a 50 y.o. male.  HPI   Patient complains of shortness of breath.  Patient states his symptoms started a couple days ago.  He has had increasing shortness of breath although though it did get acutely worse today at work.  Patient does have a history of hypertension.  He stopped taking his medications in December because his doctor changed and he did not follow-up with a new doctor.  Patient has not had any fevers.  He has noticed some leg swelling.  He denies any chest pain  Past Medical History:  Diagnosis Date  . Hypertension     There are no problems to display for this patient.   History reviewed. No pertinent surgical history.     No family history on file.  Social History   Tobacco Use  . Smoking status: Never Smoker  Substance Use Topics  . Alcohol use: Yes  . Drug use: No    Home Medications Prior to Admission medications   Medication Sig Start Date End Date Taking? Authorizing Provider  hydrochlorothiazide (HYDRODIURIL) 25 MG tablet Take 25 mg by mouth daily. 08/04/20  Yes [provider]  losartan (COZAAR) 100 MG tablet Take 100 mg by mouth daily. 08/04/20  Yes [provider]    Allergies    Patient has no known allergies.  Review of Systems   Review of Systems  All other systems reviewed and are negative.   Physical Exam Updated Vital Signs BP (!) 193/142   Pulse 95   Temp 98.3 F (36.8 C) (Oral)   Resp (!) 26   SpO2 97%   Physical Exam Vitals and nursing note reviewed.  Constitutional:      General: He is not in acute distress.    Appearance: He is well-developed.  HENT:     Head: Normocephalic and atraumatic.     Right Ear: External ear normal.     Left Ear: External ear normal.  Eyes:     General: No  scleral icterus.       Right eye: No discharge.        Left eye: No discharge.     Conjunctiva/sclera: Conjunctivae normal.  Neck:     Trachea: No tracheal deviation.  Cardiovascular:     Rate and Rhythm: Normal rate and regular rhythm.  Pulmonary:     Effort: Pulmonary effort is normal. No respiratory distress.     Breath sounds: Normal breath sounds. No stridor. No wheezing or rales.  Abdominal:     General: Bowel sounds are normal. There is no distension.     Palpations: Abdomen is soft.     Tenderness: There is no abdominal tenderness. There is no guarding or rebound.  Musculoskeletal:     Cervical back: Neck supple.     Right lower leg: Edema present.     Left lower leg: Edema present.  Skin:    General: Skin is warm and dry.     Findings: No rash.  Neurological:     Mental Status: He is alert.     Cranial Nerves: No cranial nerve deficit (no facial droop, extraocular movements intact, no slurred speech).     Sensory: No sensory deficit.     Motor: No abnormal muscle tone or seizure activity.  Coordination: Coordination normal.     ED Results / Procedures / Treatments   Labs (all labs ordered are listed, but only abnormal results are displayed) Labs Reviewed  RESP PANEL BY RT-PCR (FLU A&B, COVID) ARPGX2  BASIC METABOLIC PANEL  CBC  BRAIN NATRIURETIC PEPTIDE  TROPONIN I (HIGH SENSITIVITY)  TROPONIN I (HIGH SENSITIVITY)    EKG EKG Interpretation  Date/Time:  Thursday Jan 04 2021 11:39:21 EDT Ventricular Rate:  100 PR Interval:  154 QRS Duration: 96 QT Interval:  378 QTC Calculation: 487 R Axis:   -30 Text Interpretation: Normal sinus rhythm Possible Left atrial enlargement Left axis deviation Minimal voltage criteria for LVH, may be normal variant ( Cornell product ) Anterior infarct , age undetermined Abnormal ECG No significant change since last tracing Confirmed by Linwood Dibbles [phone]) on 01/04/2021 1:54:57 PM   Radiology DG Chest 2 View  Result Date:  01/04/2021 CLINICAL DATA:  Shortness of breath EXAM: CHEST - 2 VIEW COMPARISON:  May 16, 2017 FINDINGS: There is cardiomegaly with pulmonary venous hypertension. There is interstitial edema. There is mild left midlung atelectasis. No consolidation. No adenopathy. No bone lesions. IMPRESSION: Cardiomegaly with pulmonary vascular congestion. Interstitial edema. Appearance indicative of congestive heart failure. No consolidation. Mild left midlung atelectasis. Electronically Signed   By: Bretta Bang III M.D.   On: 01/04/2021 12:30    Procedures .Critical Care Performed by: Linwood Dibbles, MD Authorized by: Linwood Dibbles, MD   Critical care provider statement:    Critical care time (minutes):  45   Critical care was time spent personally by me on the following activities:  Discussions with consultants, evaluation of patient's response to treatment, examination of patient, ordering and performing treatments and interventions, ordering and review of laboratory studies, ordering and review of radiographic studies, pulse oximetry, re-evaluation of patient's condition, obtaining history from patient or surrogate and review of old charts     Medications Ordered in ED Medications  labetalol (NORMODYNE) injection 20 mg (has no administration in time range)  furosemide (LASIX) injection 40 mg (has no administration in time range)  nitroGLYCERIN (NITROGLYN) 2 % ointment 1 inch (has no administration in time range)    ED Course  I have reviewed the triage vital signs and the nursing notes.  Pertinent labs & imaging results that were available during my care of the patient were reviewed by me and considered in my medical decision making (see chart for details).    MDM Rules/Calculators/A&P                         Patient presents with shortness of breath.  He is notably hypertensive in the ED.  Patient's chest x-ray also suggests vascular congestion.  We will treat him with antihypertensive agents  and diuretics.  Laboratory tests are pending.  Care turned over to oncoming MD.    Final Clinical Impression(s) / ED Diagnoses Final diagnoses:  Hypertensive urgency  Acute congestive heart failure, unspecified heart failure type [facility])      Linwood Dibbles, MD 01/04/21 1525

## 2021-01-05 ENCOUNTER — Encounter (HOSPITAL_COMMUNITY): Payer: Self-pay | Admitting: Internal Medicine

## 2021-01-05 ENCOUNTER — Other Ambulatory Visit (HOSPITAL_COMMUNITY): Payer: Self-pay

## 2021-01-05 ENCOUNTER — Inpatient Hospital Stay (HOSPITAL_COMMUNITY): Payer: BC Managed Care – PPO

## 2021-01-05 DIAGNOSIS — I5041 Acute combined systolic (congestive) and diastolic (congestive) heart failure: Secondary | ICD-10-CM | POA: Diagnosis not present

## 2021-01-05 DIAGNOSIS — Z955 Presence of coronary angioplasty implant and graft: Secondary | ICD-10-CM

## 2021-01-05 DIAGNOSIS — I160 Hypertensive urgency: Secondary | ICD-10-CM

## 2021-01-05 LAB — CBC
HCT: 42 % (ref 39.0–52.0)
Hemoglobin: 13.7 g/dL (ref 13.0–17.0)
MCH: 30 pg (ref 26.0–34.0)
MCHC: 32.6 g/dL (ref 30.0–36.0)
MCV: 92.1 fL (ref 80.0–100.0)
Platelets: 333 10*3/uL (ref 150–400)
RBC: 4.56 MIL/uL (ref 4.22–5.81)
RDW: 13.4 % (ref 11.5–15.5)
WBC: 8.3 10*3/uL (ref 4.0–10.5)
nRBC: 0 % (ref 0.0–0.2)

## 2021-01-05 LAB — COMPREHENSIVE METABOLIC PANEL
ALT: 20 U/L (ref 0–44)
AST: 17 U/L (ref 15–41)
Albumin: 3.1 g/dL — ABNORMAL LOW (ref 3.5–5.0)
Alkaline Phosphatase: 70 U/L (ref 38–126)
Anion gap: 5 (ref 5–15)
BUN: 11 mg/dL (ref 6–20)
CO2: 26 mmol/L (ref 22–32)
Calcium: 8.5 mg/dL — ABNORMAL LOW (ref 8.9–10.3)
Chloride: 106 mmol/L (ref 98–111)
Creatinine, Ser: 1.21 mg/dL (ref 0.61–1.24)
GFR, Estimated: >60 mL/min (ref >60)
Glucose, Bld: 95 mg/dL (ref 70–99)
Potassium: 3.5 mmol/L (ref 3.5–5.1)
Sodium: 137 mmol/L (ref 135–145)
Total Bilirubin: 0.7 mg/dL (ref 0.3–1.2)
Total Protein: 6.5 g/dL (ref 6.5–8.1)

## 2021-01-05 LAB — LIPID PANEL
Cholesterol: 218 mg/dL — ABNORMAL HIGH (ref 0–200)
HDL: 30 mg/dL — ABNORMAL LOW (ref >40)
LDL Cholesterol: 156 mg/dL — ABNORMAL HIGH (ref 0–99)
Total CHOL/HDL Ratio: 7.3 RATIO
Triglycerides: 162 mg/dL — ABNORMAL HIGH (ref <150)
VLDL: 32 mg/dL (ref 0–40)

## 2021-01-05 LAB — ECHOCARDIOGRAM COMPLETE
Area-P 1/2: 4.29 cm2
Calc EF: 28 %
Height: 73 in
S' Lateral: 5.7 cm
Single Plane A2C EF: 26.4 %
Single Plane A4C EF: 24.3 %
Weight: 4934.78 oz

## 2021-01-05 LAB — HIV ANTIBODY (ROUTINE TESTING W REFLEX): HIV Screen 4th Generation wRfx: NONREACTIVE

## 2021-01-05 LAB — HEMOGLOBIN A1C
Hgb A1c MFr Bld: 5.7 % — ABNORMAL HIGH (ref 4.8–5.6)
Mean Plasma Glucose: 116.89 mg/dL

## 2021-01-05 LAB — MAGNESIUM: Magnesium: 1.9 mg/dL (ref 1.7–2.4)

## 2021-01-05 LAB — PHOSPHORUS: Phosphorus: 3.2 mg/dL (ref 2.5–4.6)

## 2021-01-05 MED ORDER — ROSUVASTATIN CALCIUM 20 MG PO TABS
20.0000 mg | ORAL_TABLET | Freq: Every day | ORAL | Status: DC
  Administered 2021-01-05: 20 mg via ORAL
  Filled 2021-01-05: qty 1

## 2021-01-05 MED ORDER — CARVEDILOL 3.125 MG PO TABS
3.1250 mg | ORAL_TABLET | Freq: Two times a day (BID) | ORAL | Status: DC
  Administered 2021-01-05: 3.125 mg via ORAL
  Filled 2021-01-05: qty 1

## 2021-01-05 MED ORDER — HYDRALAZINE HCL 25 MG PO TABS
25.0000 mg | ORAL_TABLET | Freq: Once | ORAL | Status: AC
  Administered 2021-01-05: 25 mg via ORAL
  Filled 2021-01-05: qty 1

## 2021-01-05 MED ORDER — ASPIRIN 81 MG PO CHEW
81.0000 mg | CHEWABLE_TABLET | Freq: Every day | ORAL | Status: DC
  Administered 2021-01-05 – 2021-01-08 (×4): 81 mg via ORAL
  Filled 2021-01-05 (×4): qty 1

## 2021-01-05 MED ORDER — METOPROLOL TARTRATE 5 MG/5ML IV SOLN
5.0000 mg | Freq: Once | INTRAVENOUS | Status: AC
  Administered 2021-01-05: 5 mg via INTRAVENOUS
  Filled 2021-01-05: qty 5

## 2021-01-05 MED ORDER — CLONIDINE HCL 0.2 MG PO TABS: 0.2000 mg | ORAL_TABLET | Freq: Once | ORAL | Status: DC

## 2021-01-05 MED ORDER — SACUBITRIL-VALSARTAN 49-51 MG PO TABS
1.0000 | ORAL_TABLET | Freq: Two times a day (BID) | ORAL | Status: DC
  Administered 2021-01-06 – 2021-01-08 (×5): 1 via ORAL
  Filled 2021-01-05 (×5): qty 1

## 2021-01-05 MED ORDER — CARVEDILOL 6.25 MG PO TABS
6.2500 mg | ORAL_TABLET | Freq: Two times a day (BID) | ORAL | Status: DC
  Administered 2021-01-06 (×2): 6.25 mg via ORAL
  Filled 2021-01-05 (×2): qty 1

## 2021-01-05 MED ORDER — PERFLUTREN LIPID MICROSPHERE
1.0000 mL | INTRAVENOUS | Status: AC | PRN
  Administered 2021-01-05: 2 mL via INTRAVENOUS
  Filled 2021-01-05: qty 10

## 2021-01-05 MED ORDER — SPIRONOLACTONE 25 MG PO TABS
25.0000 mg | ORAL_TABLET | Freq: Every day | ORAL | Status: DC
  Administered 2021-01-05 – 2021-01-08 (×4): 25 mg via ORAL
  Filled 2021-01-05 (×4): qty 1

## 2021-01-05 MED ORDER — ROSUVASTATIN CALCIUM 20 MG PO TABS
40.0000 mg | ORAL_TABLET | Freq: Every day | ORAL | Status: DC
  Administered 2021-01-06 – 2021-01-08 (×3): 40 mg via ORAL
  Filled 2021-01-05 (×3): qty 2

## 2021-01-05 NOTE — Progress Notes (Signed)
Heart Failure Stewardship Pharmacist Progress Note   PCP: Pcp, No PCP-Cardiologist: None    HPI:  50 yo M with PMH of CAD, ischemic cardiomyopathy, HTN, HLD, medication noncompliance, and prediabetes. He presented to the ED on 01/04/21 with shortness of breath and LE edema. CXR on admission with cardiomegaly and pulmonary vascular congestion with interstitial edema. An ECHO was done on 01/05/21 and LVEF was 20-25%, G2DD, RV systolic function normal.   Current HF Medications: Furosemide 40 mg IV q12h Carvedilol 3.125 mg BID Losartan 100 mg daily  Prior to admission HF Medications: None - off all medications since December 2021  Pertinent Lab Values: . Serum creatinine 1.21, BUN 11, Potassium 3.5, Sodium 137, BNP 707.6, Magnesium 1.9   Vital Signs: . Weight: 308 lbs (admission weight: 320 lbs - estimated) . Blood pressure: 150/100s  . Heart rate: 80s   Medication Assistance / Insurance Benefits Check: Does the patient have prescription insurance?  Yes Type of insurance plan: Rx [employer] - commercial insurance  Outpatient Pharmacy:  Prior to admission outpatient pharmacy: [facility] Is the patient willing to use [facility] TOC pharmacy at discharge? Yes Is the patient willing to transition their outpatient pharmacy to utilize a [facility] outpatient pharmacy?   Pending    Assessment: 1. 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 NYHA class III symptoms. - Continue furosemide 40 mg IV q12h - Continue carvedilol 3.125 mg BID - Recommend transitioning from losartan 100 mg daily to Entresto 49/51 mg BID - Consider starting spironolactone 25 mg daily - Consider starting Farxiga 10 mg daily   Plan: 1) Medication changes recommended at this time: -Stop losartan and start Entresto 49/51 mg BID -Start spironolactone and Farxiga prior to discharge  2) Patient assistance: - Entresto copay $50 per  month - can enroll in copay card to lower to $10 per 1-3 month supply - Farxiga copay $0 per month  3)  Education  - To be completed prior to discharge  Sharen Hones, PharmD, BCPS Heart Failure Engineer, building services Phone [phone]
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
## 2021-01-05 NOTE — Plan of Care (Signed)
HF booklet successfully reviewed with patient; teach back method used. Patient will need a scale for home use. He is aware to weigh himself after voiding after he leaves the hospital and to record these weights. Diet, exercise, and medication discussed.

## 2021-01-05 NOTE — Plan of Care (Signed)
  Problem: Education: Goal: Ability to verbalize understanding of medication therapies will improve Outcome: Progressing   Problem: Activity: Goal: Capacity to carry out activities will improve Outcome: Progressing   Problem: Cardiac: Goal: Ability to achieve and maintain adequate cardiopulmonary perfusion will improve Outcome: Progressing   Problem: Education: Goal: Knowledge of General Education information will improve Description: Including pain rating scale, medication(s)/side effects and non-pharmacologic comfort measures Outcome: Progressing   Problem: Activity: Goal: Risk for activity intolerance will decrease Outcome: Progressing   Problem: Coping: Goal: Level of anxiety will decrease Outcome: Progressing   Problem: Safety: Goal: Ability to remain free from injury will improve Outcome: Progressing   Problem: Skin Integrity: Goal: Risk for impaired skin integrity will decrease Outcome: Progressing

## 2021-01-05 NOTE — Consult Note (Signed)
Cardiology Consultation:   Patient ID: Scott Dunlap MRN: [id number]; DOB: 09-Jun-1971  Admit date: 01/04/2021 Date of Consult: 01/05/2021  PCP:  Scott Dunlap, No   [facility] Providers Cardiologist: Follows at [facility] }    Patient Profile:   Scott Dunlap is a 50 y.o. 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 Maylene Dunlap.   History of Present Illness:   Scott Dunlap follows [facility] for cardiology needs primarily.   He has CAD and received PCI with BMS to LAD in 07/17/2016.  He suffered a STEMI in 2018, cardiac catheterization on 05/08/2017 revealed in-stent occlusion of LAD, where he was treated with PCI/PTCA of proximal LAD.  Echocardiogram from 05/17/2017 revealed moderate global LV hypokinesis, EF 30 to 35%, mild to moderate concentric LVH, mild MR.    He was last seen by cardiology in the office on 05/03/2019 at [facility] for follow-up.  It was reportedly at that he is historically noncompliant with his medication including antiplatelets, statin, and antihypertensives.  He was noted with severely elevated blood pressure at the time.  He had reported no chest pain and appeared compensated from heart failure standpoint at that time.  He was resumed on his Crestor, carvedilol, losartan by his cardiologist.   Patient presented to the ER on 01/04/2021 evening with chief complaints of 2 to 3 days duration of worsening shortness of breath with exertion. He states he was not able to lay flat last night due to shortness of breath and was not able to sleep.  He has been having dry cough.  He reports taking no antihypertensive since December last year because no refills.  He states he did  not follow-up with his cardiologist at [facility] because he did not like them.  He states he normally weighs 327 pounds.  He denied ever having any history of chest pain or chest discomfort, dizziness, leg edema, heart palpitation.  He was noted extremely hypertensive with blood pressure up to 201/142 at ED. he reports improved shortness of breath and resolved cough since admission.  He reports increased urination with 6 bottles of urinal filled today.   Diagnostic work-up at admission showed hypokalemia 3.4, creatinine 1.17, GFR >60.  BNP elevated 707.  High sensitive troponin 72 >66.  CBC grossly unremarkable.  Hemoglobin A1c 5.7%.  Flu and COVID-19 negative.  HIV screen negative.  Chest x-rayRevealed cardiomegaly with pulmonary vascular congestion, interstitial edema.  Consistent with CHF. EKG revealed sinus rhythm with ventricular rate of 100 bpm, left atrial enlargement and LVH, old anterio infarct.  Echocardiogram completed on 01/05/21 revealed LVEF 20 to 25%, LV global hypokinesis, LV moderately dilated, severe concentric LVH, grade 2 DD, RV normal, mildly elevated PA systolic pressure, RV systolic pressure 81.2 mmHg, trivial MR, mild dilatation of ascending aorta measuring 38 mm.  He is subsequently admitted to hospital medicine service, started on IV Lasix 40 mg twice daily.  Cardiology consult is requested to assess further management    Past Medical History:  Diagnosis Date  . Hypertension     History reviewed. No pertinent surgical history.   Home Medications:  Prior to Admission medications   Medication Sig Start Date End Date Taking? Authorizing Provider  hydrochlorothiazide (HYDRODIURIL) 25  MG tablet Take 25 mg by mouth daily. 08/04/20  Yes [provider]  losartan (COZAAR) 100 MG tablet Take 100 mg by mouth daily. 08/04/20  Yes [provider]    Inpatient Medications: Scheduled Meds: . amLODipine  10 mg Oral Daily  . carvedilol  3.125 mg Oral BID WC  .  enoxaparin (LOVENOX) injection  70 mg Subcutaneous Q24H  . furosemide  40 mg Intravenous Q12H  . losartan  100 mg Oral Daily  . nitroGLYCERIN  1 inch Topical Q6H  . potassium chloride  40 mEq Oral BID  . rosuvastatin  20 mg Oral Daily  . sodium chloride flush  3 mL Intravenous Q12H   Continuous Infusions: . sodium chloride     PRN Meds: sodium chloride, acetaminophen, ondansetron (ZOFRAN) IV, sodium chloride flush  Allergies:   No Known Allergies  Social History:   Social History   Socioeconomic History  . Marital status: Legally Separated    Spouse name: Not on file  . Number of children: 1  . Years of education: Not on file  . Highest education level: Some college, no degree  Occupational History  . Occupation: clamp truck Mining engineer  Tobacco Use  . Smoking status: Never Smoker  . Smokeless tobacco: Never Used  Vaping Use  . Vaping Use: Never used  Substance and Sexual Activity  . Alcohol use: Yes    Alcohol/week: 25.0 standard drinks    Types: 25 Shots of liquor per week    Comment: 5 shots every other day of vodka  . Drug use: Yes    Frequency: 14.0 times per week    Types: Marijuana  . Sexual activity: Not on file  Other Topics Concern  . Not on file  Social History Narrative  . Not on file   Social Determinants of Health   Financial Resource Strain: Low Risk   . Difficulty of Paying Living Expenses: Not very hard  Food Insecurity: No Food Insecurity  . Worried About Charity fundraiser in the Last Year: Never true  . Ran Out of Food in the Last Year: Never true  Transportation Needs: No Transportation Needs  . Lack of Transportation (Medical): No  . Lack of Transportation (Non-Medical): No  Physical Activity: Not on file  Stress: Not on file  Social Connections: Socially Isolated  . Frequency of Communication with Friends and Family: Twice a week  . Frequency of Social Gatherings with Friends and Family: Never  . Attends Religious Services: Never  .  Active Member of Clubs or Organizations: No  . Attends Archivist Meetings: Never  . Marital Status: Separated  Intimate Partner Violence: Not on file    Family History:   Mother: History of hypertension  ROS:  Constitutional: Denied fever, chills, malaise, night sweats Eyes: Denied vision change or loss Ears/Nose/Mouth/Throat: Denied ear ache, sore throat, sinus pain Cardiovascular: see HPI Respiratory: see HPI  Gastrointestinal: Denied nausea, vomiting, abdominal pain, diarrhea Genital/Urinary: Denied dysuria, hematuria, urinary frequency/urgency Musculoskeletal: Denied muscle ache, joint pain, weakness Skin: Denied rash, wound Neuro: Denied headache, dizziness, syncope Psych: Denied history of depression/anxiety  Endocrine: Denied history of diabetes  Physical Exam/Data:   Vitals:   01/05/21 0430 01/05/21 0500 01/05/21 0741 01/05/21 1508  BP: (!) 147/91  (!) 153/99 (!) 151/102  Pulse: 69  78 81  Resp: (!) 22  17   Temp: 97.8 F (36.6 C)  98.4 F (36.9 C) 98.4 F (36.9 C)  TempSrc: Oral  Oral Oral  SpO2: 97%  97% 97%  Weight:  (!) 139.9 kg    Height:        Intake/Output Summary (Last 24 hours) at 01/05/2021 1631 Last data filed at 01/05/2021 1500 Gross per 24 hour  Intake 963 ml  Output 1350 ml  Net -387 ml   Last 3 Weights 01/05/2021 01/05/2021 01/04/2021  Weight (lbs) 308 lb 6.8 oz 308 lb 6.8 oz 320 lb  Weight (kg) 139.9 kg 139.9 kg 145.151 kg     Body mass index is 40.69 kg/m.   Vitals:  Vitals:   01/05/21 0741 01/05/21 1508  BP: (!) 153/99 (!) 151/102  Pulse: 78 81  Resp: 17   Temp: 98.4 F (36.9 C) 98.4 F (36.9 C)  SpO2: 97% 97%   General Appearance: In no apparent distress, laying in bed HEENT: Normocephalic, atraumatic. EOMs intact. Sclera anicteric. Neck: Supple, trachea midline, no JVDs  Cardiovascular: Regular rate and rhythm, normal S1-S2,  no murmur/rub/gallop Respiratory: Resting breathing unlabored, lungs sounds with bibasilar  rales to auscultation bilaterally, no use of accessory muscles. On room air.  Able to speak full sentence Gastrointestinal: Bowel sounds positive, abdomen obese, soft, non-tender, non-distended.  Extremities: Able to move all extremities in bed without difficulty, no edema/cyanosis/clubbing Genitourinary: genital exam not performed Musculoskeletal: Normal muscle bulk and tone, muscle strength 5/5 throughout, no limited range of motion Skin: Intact, warm, dry. No rashes or petechiae noted in exposed areas.  Neurologic: Alert, oriented to person, place and time. Fluent speech, no cognitive deficit,  no gross focal neuro deficit Psychiatric: Normal affect. Mood is appropriate.    EKG:  The EKG was personally reviewed and demonstrates: EKG revealed sinus rhythm with ventricular rate of 100 bpm, left atrial enlargement and LVH, old anterio infarct.   Telemetry:  Telemetry was personally reviewed and demonstrates: Sinus rhythm with ventricular rate of 80 to 90s, occasional PVCs, artifact, 5 beats of NSVT noted x1  Relevant CV Studies:  Echocardiogram from 01/05/21:  1. Left ventricular ejection fraction, by estimation, is 20 to 25%. The  left ventricle has severely decreased function. The left ventricle  demonstrates global hypokinesis. The left ventricular internal cavity size  was moderately dilated. There is severe  concentric left ventricular hypertrophy. Left ventricular diastolic  parameters are consistent with Grade II diastolic dysfunction  (pseudonormalization). Elevated left atrial pressure.  2. Right ventricular systolic function is normal. The right ventricular  size is normal. There is mildly elevated pulmonary artery systolic  pressure. The estimated right ventricular systolic pressure is 85.4 mmHg.  3. The mitral valve is normal in structure. Trivial mitral valve  regurgitation.  4. The aortic valve is tricuspid. Aortic valve regurgitation is not  visualized. No aortic  stenosis is present.  5. Aortic dilatation noted. There is mild dilatation of the ascending  aorta, measuring 38 mm.  6. The inferior vena cava is dilated in size with <50% respiratory  variability, suggesting right atrial pressure of 15 mmHg.   Echocardiogram to [facility] from 05/17/2017:  Moderate global LV hypokinesis.  Ejection fraction is visually estimated at 30-35%  Mild to moderate concentric left ventricular hypertrophy  Mild mitral regurgitation  No significant change vs previous echo    Cardiac catheterization at [facility] 05/16/17 for STEMI:  LAD: Acute occlusion.  Lesion on Prox LAD: Proximal subsection.100% stenosis 15 mm length reduced to 0%. Pre procedure TIMI 0 flow was noted. Post Procedure TIMI III flow was present. Poorrun off was present. The lesion was diagnosed  as Moderate Risk (B). The lesion showed evidence of present thrombus. Bifurcation lesion.   Devices used  - Abbott 0.014" x 190cm BMW J-Tip PTCI Guidewire. Number of passes: 1.  - 3.5x20 Euphora. 4inflation(s) to a max pressure of: 14 atm.  - 4.0x15 Euphora. 1 inflation(s) to a max pressure of: 8 atm.  - 4.0x15 Euphora. 3 inflation(s) to a max pressure of: 10 atm.  - 4.0x20 Euphora. 2 inflation(s) to a max pressure of: 8 atm.   LMCA: 0% and Normal.  LCx: 0% and Normal.  RCA: 0% and Normal.   Successful PCI / PTCA of the proximal Left Anterior Descending Coronary     Laboratory Data:  High Sensitivity Troponin:   Recent Labs  Lab 01/04/21 1510 01/04/21 1847  TROPONINIHS 72* 66*     Chemistry Recent Labs  Lab 01/04/21 1510 01/05/21 0623  NA 139 137  K 3.4* 3.5  CL 105 106  CO2 28 26  GLUCOSE 93 95  BUN 10 11  CREATININE 1.17 1.21  CALCIUM 8.5* 8.5*  GFRNONAA >60 >60  ANIONGAP 6 5    Recent Labs  Lab 01/05/21 0623  PROT 6.5  ALBUMIN 3.1*  AST 17  ALT 20  ALKPHOS 70  BILITOT 0.7   Hematology Recent Labs  Lab 01/04/21 1510 01/05/21 0623  WBC  9.5 8.3  RBC 4.70 4.56  HGB 14.2 13.7  HCT 43.6 42.0  MCV 92.8 92.1  MCH 30.2 30.0  MCHC 32.6 32.6  RDW 13.2 13.4  PLT 362 333   BNP Recent Labs  Lab 01/04/21 1510  BNP 707.6*    DDimer No results for input(s): DDIMER in the last 168 hours.   Radiology/Studies:  DG Chest 2 View  Result Date: 01/04/2021 CLINICAL DATA:  Shortness of breath EXAM: CHEST - 2 VIEW COMPARISON:  May 16, 2017 FINDINGS: There is cardiomegaly with pulmonary venous hypertension. There is interstitial edema. There is mild left midlung atelectasis. No consolidation. No adenopathy. No bone lesions. IMPRESSION: Cardiomegaly with pulmonary vascular congestion. Interstitial edema. Appearance indicative of congestive heart failure. No consolidation. Mild left midlung atelectasis. Electronically Signed   By: Lowella Grip III M.D.   On: 01/04/2021 12:30   ECHOCARDIOGRAM COMPLETE  Result Date: 01/05/2021    ECHOCARDIOGRAM REPORT   Patient Name:   [facility] Date of Exam: 01/05/2021 Medical Rec #:  [id number]     Height:       73.0 in Accession #:    [id number]    Weight:       308.4 lb Date of Birth:  1970-09-08     BSA:          2.587 m Patient Age:    32 years      BP:           153/99 mmHg Patient Gender: M             HR:           88 bpm. Exam Location:  Inpatient Procedure: 2D Echo, Cardiac Doppler, Color Doppler and Intracardiac            Opacification Agent Indications:    I50.40* Unspecified combined systolic (congestive) and diastolic                 (congestive) heart failure  History:        Patient has no prior history of Echocardiogram examinations.  CHF; Risk Factors:Hypertension.  Sonographer:    Roseanna Rainbow RDCS Referring Phys: [id number] [place]  Sonographer Comments: Technically difficult study due to poor echo windows and patient is morbidly obese. Image acquisition challenging due to patient body habitus. IMPRESSIONS  1. Left ventricular ejection fraction, by estimation, is 20  to 25%. The left ventricle has severely decreased function. The left ventricle demonstrates global hypokinesis. The left ventricular internal cavity size was moderately dilated. There is severe  concentric left ventricular hypertrophy. Left ventricular diastolic parameters are consistent with Grade II diastolic dysfunction (pseudonormalization). Elevated left atrial pressure.  2. Right ventricular systolic function is normal. The right ventricular size is normal. There is mildly elevated pulmonary artery systolic pressure. The estimated right ventricular systolic pressure is 45.4 mmHg.  3. The mitral valve is normal in structure. Trivial mitral valve regurgitation.  4. The aortic valve is tricuspid. Aortic valve regurgitation is not visualized. No aortic stenosis is present.  5. Aortic dilatation noted. There is mild dilatation of the ascending aorta, measuring 38 mm.  6. The inferior vena cava is dilated in size with <50% respiratory variability, suggesting right atrial pressure of 15 mmHg. Comparison(s): No prior Echocardiogram. FINDINGS  Left Ventricle: Left ventricular ejection fraction, by estimation, is 20 to 25%. The left ventricle has severely decreased function. The left ventricle demonstrates global hypokinesis. Definity contrast agent was given IV to delineate the left ventricular endocardial borders. The left ventricular internal cavity size was moderately dilated. There is severe concentric left ventricular hypertrophy. Left ventricular diastolic parameters are consistent with Grade II diastolic dysfunction (pseudonormalization). Elevated left atrial pressure. Right Ventricle: The right ventricular size is normal. No increase in right ventricular wall thickness. Right ventricular systolic function is normal. There is mildly elevated pulmonary artery systolic pressure. The tricuspid regurgitant velocity is 2.67  m/s, and with an assumed right atrial pressure of 15 mmHg, the estimated right ventricular  systolic pressure is 09.8 mmHg. Left Atrium: Left atrial size was normal in size. Right Atrium: Right atrial size was normal in size. Pericardium: There is no evidence of pericardial effusion. Mitral Valve: The mitral valve is normal in structure. Trivial mitral valve regurgitation. Tricuspid Valve: The tricuspid valve is normal in structure. Tricuspid valve regurgitation is trivial. Aortic Valve: The aortic valve is tricuspid. Aortic valve regurgitation is not visualized. No aortic stenosis is present. Pulmonic Valve: The pulmonic valve was normal in structure. Pulmonic valve regurgitation is trivial. Aorta: Aortic dilatation noted. There is mild dilatation of the ascending aorta, measuring 38 mm. Venous: The inferior vena cava is dilated in size with less than 50% respiratory variability, suggesting right atrial pressure of 15 mmHg. IAS/Shunts: There is right bowing of the interatrial septum, suggestive of elevated left atrial pressure. No atrial level shunt detected by color flow Doppler.  LEFT VENTRICLE PLAX 2D LVIDd:         6.60 cm      Diastology LVIDs:         5.70 cm      LV e' medial:    3.15 cm/s LV PW:         2.30 cm      LV E/e' medial:  35.9 LV IVS:        1.90 cm      LV e' lateral:   3.00 cm/s LVOT diam:     2.50 cm      LV E/e' lateral: 37.7 LV SV:         77 LV SV Index:  30 LVOT Area:     4.91 cm  LV Volumes (MOD) LV vol d, MOD A2C: 168.5 ml LV vol d, MOD A4C: 166.5 ml LV vol s, MOD A2C: 124.0 ml LV vol s, MOD A4C: 126.0 ml LV SV MOD A2C:     44.5 ml LV SV MOD A4C:     166.5 ml LV SV MOD BP:      49.4 ml RIGHT VENTRICLE             IVC RV S prime:     11.90 cm/s  IVC diam: 2.80 cm TAPSE (M-mode): 2.2 cm LEFT ATRIUM             Index       RIGHT ATRIUM           Index LA diam:        5.10 cm 1.97 cm/m  RA Area:     20.20 cm LA Vol (A2C):   77.7 ml 30.04 ml/m RA Volume:   61.70 ml  23.85 ml/m LA Vol (A4C):   85.5 ml 33.06 ml/m LA Biplane Vol: 81.8 ml 31.63 ml/m  AORTIC VALVE LVOT Vmax:    96.10 cm/s LVOT Vmean:  65.400 cm/s LVOT VTI:    0.156 m  AORTA Ao Root diam: 4.10 cm Ao Asc diam:  3.80 cm MITRAL VALVE                TRICUSPID VALVE MV Area (PHT): 4.29 cm     TR Peak grad:   28.5 mmHg MV Decel Time: 177 msec     TR Vmax:        267.00 cm/s MV E velocity: 113.00 cm/s MV A velocity: 72.80 cm/s   SHUNTS MV E/A ratio:  1.55         Systemic VTI:  0.16 m                             Systemic Diam: 2.50 cm Gwyndolyn Kaufman MD Electronically signed by Gwyndolyn Kaufman MD Signature Date/Time: 01/05/2021/12:50:34 PM    Final      Assessment and Plan:   Acute on chronic combined heart failure Ischemic cardiomyopathy -Patient presented with 2 to 3 days of shortness of breath, noncompliant with medication since 07/2020 -BNP 707 (no previous lab available) -High sensitive troponin 72 >66 -EKG with evidence of left atrial enlargement and LVH -Chest x-ray consistent with CHF -Weight 320 ib >308 ib, Net -328m (query accuracy)  since admission -He has been started on IV Lasix 40 mg twice daily, reports frequent urination, appears diuresing well, recommend continued diuresis to euvolemic -Recommend monitor strict intake and output, daily weight, fluid restriction <1.5L daily, low Na diet, daily renal index and electrolytes monitor -Discussed at length regarding importance of medication compliance, CHF signs -Recommend GDMT: Continue carvedilol 3.1246mBID, discontinue amlodipine and losartan, start Entresto 49/51 tomorrow, spironolactone 2521maily, may add Farxiga if renal index tolerating before discahrge.  Discussed risk versus benefit of above medical therapy, patient is agreeable.   Hypertensive emergency -Patient has not been taking medication since December for high blood pressure, historically noncompliant -BP 201/142 at admission, with decompensated heart failure suggest endorgan dysfunction -BP control is improving at appropriate speed -Recommend continue carvedilol, DC  amlodipine and losartan, start Entresto and spironolactone as above  -Trend BP, further adjust medication if needed  Coronary artery disease s/p PCI 2017 and PTCA of LAD 2018 Elevated troponin -He  denies any chest pain, shortness of breath or improving -EKG without acute ischemic change -High sensitive troponin 72>66 -Suspect demand ischemia -Recommend resume GDMT with aspirin 26m (he was taking 5073mat home), carvedilol, statin  Hyperlipidemia -LDL 156 on 01/05/2021, not controlled, not compliant with meds -Restarted on Crestor 20 mg here, will increase to 4076maily, repeat lipids in 1 month  Prediabetes -Hemoglobin A1c 5.7%, and CC diet, recommend add Farxiga if renal index tolerating  Hypokalemia -Repleted per primary team, trend labs    Risk Assessment/Risk Scores:    New York Heart Association (NYHA) Functional Class NYHA Class III      For questions or updates, please contact [email or web address] Please consult [email or web address] for contact info under    Signed, XikMargie BilletP  01/05/2021 4:31 PM

## 2021-01-05 NOTE — Progress Notes (Signed)
Heart Failure Nurse Navigator Progress Note  PCP: Pcp, No PCP-Cardiologist: none on file. 2020 seen by [facility] cardiology. Pt does not want to return. Admission Diagnosis: A/C CHF Admitted from: home with mother  Presentation:   Scott Dunlap presented with SOB and BLE edema. Pt sitting at bedside on room air, no acute distress noted. BP continues to be elevated 150s/100s, ntiropaste on R chest. Pt states he has not been taking his medication since December 2021 d/t refills ran out. History of medication/ appointment non-compliance. Pt does not have PCP, has not regularly seen medical care. Pt refuses to return to previous cardiology team. Lives with his mother--see HF CSW note. Pt drinks 5 shots 4-5 days/week of vodka, states he does this "I have had my blood thinner since December and alcohol is a blood thinner, right?" Educated on alcohol cessation.   ECHO/ LVEF: 20-25%, G2DD.   Clinical Course:  Past Medical History:  Diagnosis Date  . Hypertension      Social History   Socioeconomic History  . Marital status: Legally Separated    Spouse name: Not on file  . Number of children: 1  . Years of education: Not on file  . Highest education level: Some college, no degree  Occupational History  . Occupation: clamp truck Designer, television/film set  Tobacco Use  . Smoking status: Never Smoker  . Smokeless tobacco: Never Used  Vaping Use  . Vaping Use: Never used  Substance and Sexual Activity  . Alcohol use: Yes    Alcohol/week: 25.0 standard drinks    Types: 25 Shots of liquor per week    Comment: 5 shots every other day of vodka  . Drug use: Yes    Frequency: 14.0 times per week    Types: Marijuana  . Sexual activity: Not on file  Other Topics Concern  . Not on file  Social History Narrative  . Not on file   Social Determinants of Health   Financial Resource Strain: Low Risk   . Difficulty of Paying Living Expenses: Not very hard  Food Insecurity: No Food Insecurity  . Worried About  Programme researcher, broadcasting/film/video in the Last Year: Never true  . Ran Out of Food in the Last Year: Never true  Transportation Needs: No Transportation Needs  . Lack of Transportation (Medical): No  . Lack of Transportation (Non-Medical): No  Physical Activity: Not on file  Stress: Not on file  Social Connections: Socially Isolated  . Frequency of Communication with Friends and Family: Twice a week  . Frequency of Social Gatherings with Friends and Family: Never  . Attends Religious Services: Never  . Active Member of Clubs or Organizations: No  . Attends Banker Meetings: Never  . Marital Status: Separated    High Risk Criteria for Readmission and/or Poor Patient Outcomes:  Heart failure hospital admissions (last 6 months): 1   No Show rate: N/A  Difficult social situation: yes  Demonstrates medication adherence: no  Primary Language: English  Literacy level: able to read/write and comprehend  Barriers of Care:   -medication compliance -medication cost -safe housing -no PCP -no cardiologist -dietary/fluid restrictions -HF education/knowledge -no social support -marijuana use -alcohol consumption  Considerations/Referrals:   Referral made to Heart Failure Pharmacist Stewardship: yes, appreciated Referral made to Heart & Vascular TOC clinic: yes, Tuesday, May 31 @ 9AM.  Items for Follow-up on DC/TOC: -medication optimization -medication compliance -PCP -cardiology  -continue HF education -alcohol, substance use cessation -long term housing plan -depression screening  Ozella Rocks, RN, BSN Heart Failure Nurse Navigator [phone]

## 2021-01-05 NOTE — Progress Notes (Signed)
PROGRESS NOTE    Scott Dunlap  RXV:[id number] DOB: 03-23-1971 DOA: 01/04/2021 PCP: Pcp, No     Brief Narrative:  Scott Dunlap is a 50 year old male with past medical history significant for hypertension, noncompliant with medications who presented to the hospital with shortness of breath ongoing for 2 to 3 days.  He has not been taking his blood pressure medication since December.  In the emergency department, patient was noted to have elevated blood pressure to 201/142, chest x-ray revealed pulmonary vascular congestion.  BNP was elevated at 707.  Patient was admitted for new onset congestive heart failure, hypertensive urgency.  He was given IV Lasix.   New events last 24 hours / Subjective: States that he has been urinating a lot, urine output is not measured.  Denies any chest pain or pressure.  Denies any lower extremity edema.  Remains on room air.  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  At that time, note indicates that patient has not been taking his medication for a year  Hypertension -Medication noncompliance -Continue losartan, Coreg, Norvasc -Blood pressure improved  Demand ischemia CAD -Troponin 72 >> 66, secondary to above  Hypokalemia -Replace, trend  HLD -Continue crestor   Prediabetes -Hemoglobin A1c 5.7    DVT prophylaxis: Lovenox   Code Status:     Code Status Orders  (From admission, onward)          Start     Ordered   01/04/21 2059  Full code  Continuous        01/04/21 2059        Code Status History    This patient has a current code status but no historical code status.   Advance Care Planning Activity     Family Communication: None at bedside Disposition Plan:  Status is: Inpatient  Remains inpatient appropriate because:IV treatments appropriate due to intensity of illness or inability to take PO and Inpatient level of care appropriate due to severity of illness   Dispo: The patient is from: Home              Anticipated d/c is to: Home              Patient currently is not medically stable to d/c.   Difficult to place patient No      Consultants:   Cardiology  Procedures:   None   Antimicrobials:  Anti-infectives (From admission, onward)   None        Objective: Vitals:   01/05/21 0200 01/05/21 0430 01/05/21 0500 01/05/21 0741  BP: (!) 165/98 (!) 147/91  (!) 153/99  Pulse: 78 69  78  Resp: 18 (!) 22  17  Temp:  97.8 F (36.6 C)  98.4 F (36.9 C)  TempSrc:  Oral  Oral  SpO2:  97%  97%  Weight:   (!) 139.9 kg   Height:        Intake/Output Summary (Last 24 hours) at 01/05/2021 1248 Last data filed at 01/05/2021 1111 Gross  per 24 hour  Intake 723 ml  Output --  Net 723 ml   Filed Weights   01/04/21 1853 01/05/21 0055 01/05/21 0500  Weight: (!) 145.2 kg (!) 139.9 kg (!) 139.9 kg    Examination:  General exam: Appears calm and comfortable  Respiratory system: Clear to auscultation. Respiratory effort normal. No respiratory distress. No conversational dyspnea. On room air  Cardiovascular system: S1 & S2 heard, RRR. No murmurs. Trace pedal edema. Gastrointestinal system: Abdomen is nondistended, soft and nontender. Normal bowel sounds heard. Central nervous system: Alert and oriented. No focal neurological deficits. Speech clear.  Extremities: Symmetric in appearance  Skin: No rashes, lesions or ulcers on exposed skin   Psychiatry: Judgement and insight appear normal. Mood & affect appropriate.   Data Reviewed: I have personally reviewed following labs and imaging studies  CBC: Recent Labs  Lab 01/04/21 1510 01/05/21 0623  WBC 9.5 8.3  HGB 14.2 13.7  HCT 43.6 42.0  MCV 92.8 92.1  PLT 362 333   Basic Metabolic Panel: Recent Labs  Lab 01/04/21 1510 01/05/21 0623  NA 139 137  K 3.4* 3.5  CL 105 106  CO2 28 26  GLUCOSE 93 95  BUN 10 11  CREATININE 1.17 1.21  CALCIUM 8.5* 8.5*  MG  --  1.9  PHOS  --  3.2   GFR: Estimated Creatinine Clearance: 107.3 mL/min (by C-G formula based on SCr of 1.21 mg/dL). Liver Function Tests: Recent Labs  Lab 01/05/21 0623  AST 17  ALT 20  ALKPHOS 70  BILITOT 0.7  PROT 6.5  ALBUMIN 3.1*   No results for input(s): LIPASE, AMYLASE in the last 168 hours. No results for input(s): AMMONIA in the last 168 hours. Coagulation Profile: No results for input(s): INR, PROTIME in the last 168 hours. Cardiac Enzymes: No results for input(s): CKTOTAL, CKMB, CKMBINDEX, TROPONINI in the last 168 hours. BNP (last 3 results) No results for input(s): PROBNP in the last 8760 hours. HbA1C: Recent Labs    01/05/21 0623  HGBA1C 5.7*   CBG: No results for input(s): GLUCAP in the last 168 hours. Lipid Profile: Recent Labs    01/05/21 0623  CHOL 218*  HDL 30*  LDLCALC 156*  TRIG 162*  CHOLHDL 7.3   Thyroid Function Tests: No results for input(s): TSH, T4TOTAL, FREET4, T3FREE, THYROIDAB in the last 72 hours. Anemia Panel: No results for input(s): VITAMINB12, FOLATE, FERRITIN, TIBC, IRON, RETICCTPCT in the last 72 hours. Sepsis Labs: No results for input(s): PROCALCITON, LATICACIDVEN in the last 168 hours.  Recent Results (from the past 240 hour(s))  Resp Panel by RT-PCR (Flu A&B, Covid) Nasopharyngeal Swab     Status: None   Collection Time: 01/04/21  2:31 PM   Specimen: Nasopharyngeal Swab; Nasopharyngeal(NP) swabs in vial transport medium  Result Value  Ref Range Status   SARS Coronavirus 2 by RT PCR NEGATIVE NEGATIVE Final    Comment: (NOTE) SARS-CoV-2 target nucleic acids are NOT DETECTED.  The SARS-CoV-2 RNA is generally detectable in upper respiratory specimens during the acute phase of infection. The lowest concentration of SARS-CoV-2 viral copies this assay can detect is 138 copies/mL. A negative result does not preclude SARS-Cov-2 infection and should not be used as the sole basis for treatment or other patient management decisions. A negative result may occur with  improper specimen collection/handling, submission of specimen other than nasopharyngeal swab, presence of viral mutation(s) within the areas targeted by this assay, and inadequate number of viral copies(<138 copies/mL). A  negative result must be combined with clinical observations, patient history, and epidemiological information. The expected result is Negative.  Fact Sheet for Patients:  BloggerCourse.com  Fact Sheet for Healthcare Providers:  SeriousBroker.it  This test is no t yet approved or cleared by the [place] FDA and  has been authorized for detection and/or diagnosis of SARS-CoV-2 by FDA under an Emergency Use Authorization (EUA). This EUA will remain  in effect (meaning this test can be used) for the duration of the COVID-19 declaration under Section 564(b)(1) of the Act, 21 U.S.C.section 360bbb-3(b)(1), unless the authorization is terminated  or revoked sooner.       Influenza A by PCR NEGATIVE NEGATIVE Final   Influenza B by PCR NEGATIVE NEGATIVE Final    Comment: (NOTE) The Xpert Xpress SARS-CoV-2/FLU/RSV plus assay is intended as an aid in the diagnosis of influenza from Nasopharyngeal swab specimens and should not be used as a sole basis for treatment. Nasal washings and aspirates are unacceptable for Xpert Xpress SARS-CoV-2/FLU/RSV testing.  Fact Sheet for  Patients: BloggerCourse.com  Fact Sheet for Healthcare Providers: SeriousBroker.it  This test is not yet approved or cleared by the [place] FDA and has been authorized for detection and/or diagnosis of SARS-CoV-2 by FDA under an Emergency Use Authorization (EUA). This EUA will remain in effect (meaning this test can be used) for the duration of the COVID-19 declaration under Section 564(b)(1) of the Act, 21 U.S.C. section 360bbb-3(b)(1), unless the authorization is terminated or revoked.  Performed at [facility], [address], [place], [place] [address]       Radiology Studies: DG Chest 2 View  Result Date: 01/04/2021 CLINICAL DATA:  Shortness of breath EXAM: CHEST - 2 VIEW COMPARISON:  May 16, 2017 FINDINGS: There is cardiomegaly with pulmonary venous hypertension. There is interstitial edema. There is mild left midlung atelectasis. No consolidation. No adenopathy. No bone lesions. IMPRESSION: Cardiomegaly with pulmonary vascular congestion. Interstitial edema. Appearance indicative of congestive heart failure. No consolidation. Mild left midlung atelectasis. Electronically Signed   By: Bretta Bang III M.D.   On: 01/04/2021 12:30      Scheduled Meds: . amLODipine  10 mg Oral Daily  . enoxaparin (LOVENOX) injection  70 mg Subcutaneous Q24H  . furosemide  40 mg Intravenous Q12H  . losartan  100 mg Oral Daily  . nitroGLYCERIN  1 inch Topical Q6H  . potassium chloride  40 mEq Oral BID  . sodium chloride flush  3 mL Intravenous Q12H   Continuous Infusions: . sodium chloride       LOS: 1 day      Time spent: 25 minutes   Noralee Stain, DO [facility] 01/05/2021, 12:48 PM   Available via Epic secure chat 7am-7pm After these hours, please refer to coverage provider listed on [email or web address]

## 2021-01-05 NOTE — Progress Notes (Signed)
BP elevated, paged MD for orders.

## 2021-01-05 NOTE — Plan of Care (Signed)
  Problem: Education: Goal: Ability to demonstrate management of disease process will improve Outcome: Progressing   Problem: Education: Goal: Ability to verbalize understanding of medication therapies will improve Outcome: Progressing   Problem: Cardiac: Goal: Ability to achieve and maintain adequate cardiopulmonary perfusion will improve Outcome: Progressing   Problem: Education: Goal: Knowledge of General Education information will improve Description: Including pain rating scale, medication(s)/side effects and non-pharmacologic comfort measures Outcome: Progressing   Problem: Health Behavior/Discharge Planning: Goal: Ability to manage health-related needs will improve Outcome: Progressing   Problem: Clinical Measurements: Goal: Ability to maintain clinical measurements within normal limits will improve Outcome: Progressing

## 2021-01-05 NOTE — Progress Notes (Signed)
  Echocardiogram 2D Echocardiogram has been performed.  Scott Dunlap 01/05/2021, 11:47 AM

## 2021-01-06 DIAGNOSIS — I5023 Acute on chronic systolic (congestive) heart failure: Secondary | ICD-10-CM

## 2021-01-06 LAB — BASIC METABOLIC PANEL
Anion gap: 10 (ref 5–15)
BUN: 12 mg/dL (ref 6–20)
CO2: 25 mmol/L (ref 22–32)
Calcium: 8.6 mg/dL — ABNORMAL LOW (ref 8.9–10.3)
Chloride: 103 mmol/L (ref 98–111)
Creatinine, Ser: 1.2 mg/dL (ref 0.61–1.24)
GFR, Estimated: >60 mL/min (ref >60)
Glucose, Bld: 102 mg/dL — ABNORMAL HIGH (ref 70–99)
Potassium: 4.1 mmol/L (ref 3.5–5.1)
Sodium: 138 mmol/L (ref 135–145)

## 2021-01-06 LAB — MAGNESIUM: Magnesium: 1.8 mg/dL (ref 1.7–2.4)

## 2021-01-06 MED ORDER — FUROSEMIDE 10 MG/ML IJ SOLN
40.0000 mg | Freq: Two times a day (BID) | INTRAMUSCULAR | Status: DC
  Administered 2021-01-06 – 2021-01-07 (×3): 40 mg via INTRAVENOUS
  Filled 2021-01-06 (×3): qty 4

## 2021-01-06 MED ORDER — FUROSEMIDE 10 MG/ML IJ SOLN: 60.0000 mg | Freq: Two times a day (BID) | INTRAMUSCULAR | Status: DC

## 2021-01-06 NOTE — Progress Notes (Signed)
PROGRESS NOTE    Scott Churchimothy Davoli  ZOX:[id number] DOB: June 12, 1971 DOA: 01/04/2021 PCP: Pcp, No     Brief Narrative:  Scott Dunlap is a 50 year old male with past medical history significant for hypertension, noncompliant with medications who presented to the hospital with shortness of breath ongoing for 2 to 3 days.  He has not been taking his blood pressure medication since December.  In the emergency department, patient was noted to have elevated blood pressure to 201/142, chest x-ray revealed pulmonary vascular congestion.  BNP was elevated at 707.  Patient was admitted for new onset congestive heart failure, hypertensive urgency.  He was given IV Lasix.  Echocardiogram revealed EF 20 to 25%, grade 2 diastolic dysfunction, global hypokinesis.  Cardiology consulted.  New events last 24 hours / Subjective: He has no new complaints on examination, denies any chest pain or shortness of breath.  Remains on room air.  He has diuresed 2.3 L last 24 hours.  Assessment & Plan:   Principal Problem:   Acute CHF (congestive heart failure) (HCC) Active Problems:   Essential hypertension   CHF (congestive heart failure) (HCC)   Hypertensive urgency   Acute combined systolic and diastolic heart failure (HCC)   Morbid obesity (HCC)   S/P coronary artery stent placement   Acute on chronic combined systolic and diastolic CHF exacerbation, history of ischemic cardiomyopathy -Echocardiogram EF 20 to 25%, global hypokinesis, severe LVH, grade 2 diastolic dysfunction -Continue IV Lasix, Coreg, Entresto, Aldactone -Strict I's and O's, daily weight, fluid restriction diet -Cardiology following  Hypertension -Medication noncompliance -Continue Lasix, Coreg, Entresto, Aldactone -Blood pressure improved  Demand ischemia CAD -Troponin 72 >> 66, secondary to above -No plans for ischemic work-up at this time -Continue aspirin, Coreg, Entresto, Aldactone  Hypokalemia -Replace, trend  HLD -Continue  crestor   Prediabetes -Hemoglobin A1c 5.7    DVT prophylaxis: Lovenox   Code Status:     Code Status Orders  (From admission, onward)         Start     Ordered   01/04/21 2059  Full code  Continuous        01/04/21 2059        Code Status History    This patient has a current code status but no historical code status.   Advance Care Planning Activity     Family Communication: None at bedside Disposition Plan:  Status is: Inpatient  Remains inpatient appropriate because:IV treatments appropriate due to intensity of illness or inability to take PO and Inpatient level of care appropriate due to severity of illness   Dispo: The patient is from: Home              Anticipated d/c is to: Home              Patient currently is not medically stable to d/c.  Remains on IV Lasix.   Difficult to place patient No      Consultants:   Cardiology  Procedures:   None   Antimicrobials:  Anti-infectives (From admission, onward)   None       Objective: Vitals:   01/06/21 0012 01/06/21 0018 01/06/21 0502 01/06/21 0730  BP: (!) 157/109  (!) 162/109 (!) 159/116  Pulse: 77  78 73  Resp: 20  19 19   Temp: 98.1 F (36.7 C)  98.6 F (37 C) 98.5 F (36.9 C)  TempSrc: Oral  Oral Oral  SpO2: 99%  97% 99%  Weight:  (!) 138.2 kg  Height:        Intake/Output Summary (Last 24 hours) at 01/06/2021 1330 Last data filed at 01/06/2021 0800 Gross per 24 hour  Intake 598 ml  Output 2125 ml  Net -1527 ml   Filed Weights   01/05/21 0055 01/05/21 0500 01/06/21 0018  Weight: (!) 139.9 kg (!) 139.9 kg (!) 138.2 kg    Examination: General exam: Appears calm and comfortable  Respiratory system: Clear to auscultation. Respiratory effort normal.  On room air Cardiovascular system: S1 & S2 heard, RRR.  Trace pedal edema. Gastrointestinal system: Abdomen is nondistended, soft and nontender. Normal bowel sounds heard. Central nervous system: Alert and oriented. Non focal  exam. Speech clear  Extremities: Symmetric in appearance bilaterally  Skin: No rashes, lesions or ulcers on exposed skin  Psychiatry: Judgement and insight appear stable. Mood & affect appropriate.   Data Reviewed: I have personally reviewed following labs and imaging studies  CBC: Recent Labs  Lab 01/04/21 1510 01/05/21 0623  WBC 9.5 8.3  HGB 14.2 13.7  HCT 43.6 42.0  MCV 92.8 92.1  PLT 362 333   Basic Metabolic Panel: Recent Labs  Lab 01/04/21 1510 01/05/21 0623 01/06/21 0240  NA 139 137 138  K 3.4* 3.5 4.1  CL 105 106 103  CO2 28 26 25   GLUCOSE 93 95 102*  BUN 10 11 12   CREATININE 1.17 1.21 1.20  CALCIUM 8.5* 8.5* 8.6*  MG  --  1.9 1.8  PHOS  --  3.2  --    GFR: Estimated Creatinine Clearance: 107.5 mL/min (by C-G formula based on SCr of 1.2 mg/dL). Liver Function Tests: Recent Labs  Lab 01/05/21 0623  AST 17  ALT 20  ALKPHOS 70  BILITOT 0.7  PROT 6.5  ALBUMIN 3.1*   No results for input(s): LIPASE, AMYLASE in the last 168 hours. No results for input(s): AMMONIA in the last 168 hours. Coagulation Profile: No results for input(s): INR, PROTIME in the last 168 hours. Cardiac Enzymes: No results for input(s): CKTOTAL, CKMB, CKMBINDEX, TROPONINI in the last 168 hours. BNP (last 3 results) No results for input(s): PROBNP in the last 8760 hours. HbA1C: Recent Labs    01/05/21 0623  HGBA1C 5.7*   CBG: No results for input(s): GLUCAP in the last 168 hours. Lipid Profile: Recent Labs    01/05/21 0623  CHOL 218*  HDL 30*  LDLCALC 156*  TRIG 162*  CHOLHDL 7.3   Thyroid Function Tests: No results for input(s): TSH, T4TOTAL, FREET4, T3FREE, THYROIDAB in the last 72 hours. Anemia Panel: No results for input(s): VITAMINB12, FOLATE, FERRITIN, TIBC, IRON, RETICCTPCT in the last 72 hours. Sepsis Labs: No results for input(s): PROCALCITON, LATICACIDVEN in the last 168 hours.  Recent Results (from the past 240 hour(s))  Resp Panel by RT-PCR (Flu A&B,  Covid) Nasopharyngeal Swab     Status: None   Collection Time: 01/04/21  2:31 PM   Specimen: Nasopharyngeal Swab; Nasopharyngeal(NP) swabs in vial transport medium  Result Value Ref Range Status   SARS Coronavirus 2 by RT PCR NEGATIVE NEGATIVE Final    Comment: (NOTE) SARS-CoV-2 target nucleic acids are NOT DETECTED.  The SARS-CoV-2 RNA is generally detectable in upper respiratory specimens during the acute phase of infection. The lowest concentration of SARS-CoV-2 viral copies this assay can detect is 138 copies/mL. A negative result does not preclude SARS-Cov-2 infection and should not be used as the sole basis for treatment or other patient management decisions. A negative result may occur with  improper specimen collection/handling, submission of specimen other than nasopharyngeal swab, presence of viral mutation(s) within the areas targeted by this assay, and inadequate number of viral copies(<138 copies/mL). A negative result must be combined with clinical observations, patient history, and epidemiological information. The expected result is Negative.  Fact Sheet for Patients:  BloggerCourse.com  Fact Sheet for Healthcare Providers:  SeriousBroker.it  This test is no t yet approved or cleared by the [place] FDA and  has been authorized for detection and/or diagnosis of SARS-CoV-2 by FDA under an Emergency Use Authorization (EUA). This EUA will remain  in effect (meaning this test can be used) for the duration of the COVID-19 declaration under Section 564(b)(1) of the Act, 21 U.S.C.section 360bbb-3(b)(1), unless the authorization is terminated  or revoked sooner.       Influenza A by PCR NEGATIVE NEGATIVE Final   Influenza B by PCR NEGATIVE NEGATIVE Final    Comment: (NOTE) The Xpert Xpress SARS-CoV-2/FLU/RSV plus assay is intended as an aid in the diagnosis of influenza from Nasopharyngeal swab specimens and should  not be used as a sole basis for treatment. Nasal washings and aspirates are unacceptable for Xpert Xpress SARS-CoV-2/FLU/RSV testing.  Fact Sheet for Patients: BloggerCourse.com  Fact Sheet for Healthcare Providers: SeriousBroker.it  This test is not yet approved or cleared by the [place] FDA and has been authorized for detection and/or diagnosis of SARS-CoV-2 by FDA under an Emergency Use Authorization (EUA). This EUA will remain in effect (meaning this test can be used) for the duration of the COVID-19 declaration under Section 564(b)(1) of the Act, 21 U.S.C. section 360bbb-3(b)(1), unless the authorization is terminated or revoked.  Performed at [facility], [address], [place], [place] [address]       Radiology Studies: ECHOCARDIOGRAM COMPLETE  Result Date: 01/05/2021    ECHOCARDIOGRAM REPORT   Patient Name:   [facility] Date of Exam: 01/05/2021 Medical Rec #:  [id number]     Height:       73.0 in Accession #:    [id number]    Weight:       308.4 lb Date of Birth:  01-11-1971     BSA:          2.587 m Patient Age:    49 years      BP:           153/99 mmHg Patient Gender: M             HR:           88 bpm. Exam Location:  Inpatient Procedure: 2D Echo, Cardiac Doppler, Color Doppler and Intracardiac            Opacification Agent Indications:    I50.40* Unspecified combined systolic (congestive) and diastolic                 (congestive) heart failure  History:        Patient has no prior history of Echocardiogram examinations.                 CHF; Risk Factors:Hypertension.  Sonographer:    Sheralyn Boatman RDCS Referring Phys: [id number] [facility] POKHREL  Sonographer Comments: Technically difficult study due to poor echo windows and patient is morbidly obese. Image acquisition challenging due to patient body habitus. IMPRESSIONS  1. Left ventricular ejection fraction, by estimation, is 20 to 25%. The left ventricle has severely  decreased function. The left ventricle demonstrates global hypokinesis. The left ventricular internal  cavity size was moderately dilated. There is severe  concentric left ventricular hypertrophy. Left ventricular diastolic parameters are consistent with Grade II diastolic dysfunction (pseudonormalization). Elevated left atrial pressure.  2. Right ventricular systolic function is normal. The right ventricular size is normal. There is mildly elevated pulmonary artery systolic pressure. The estimated right ventricular systolic pressure is 43.5 mmHg.  3. The mitral valve is normal in structure. Trivial mitral valve regurgitation.  4. The aortic valve is tricuspid. Aortic valve regurgitation is not visualized. No aortic stenosis is present.  5. Aortic dilatation noted. There is mild dilatation of the ascending aorta, measuring 38 mm.  6. The inferior vena cava is dilated in size with <50% respiratory variability, suggesting right atrial pressure of 15 mmHg. Comparison(s): No prior Echocardiogram. FINDINGS  Left Ventricle: Left ventricular ejection fraction, by estimation, is 20 to 25%. The left ventricle has severely decreased function. The left ventricle demonstrates global hypokinesis. Definity contrast agent was given IV to delineate the left ventricular endocardial borders. The left ventricular internal cavity size was moderately dilated. There is severe concentric left ventricular hypertrophy. Left ventricular diastolic parameters are consistent with Grade II diastolic dysfunction (pseudonormalization). Elevated left atrial pressure. Right Ventricle: The right ventricular size is normal. No increase in right ventricular wall thickness. Right ventricular systolic function is normal. There is mildly elevated pulmonary artery systolic pressure. The tricuspid regurgitant velocity is 2.67  m/s, and with an assumed right atrial pressure of 15 mmHg, the estimated right ventricular systolic pressure is 43.5 mmHg. Left  Atrium: Left atrial size was normal in size. Right Atrium: Right atrial size was normal in size. Pericardium: There is no evidence of pericardial effusion. Mitral Valve: The mitral valve is normal in structure. Trivial mitral valve regurgitation. Tricuspid Valve: The tricuspid valve is normal in structure. Tricuspid valve regurgitation is trivial. Aortic Valve: The aortic valve is tricuspid. Aortic valve regurgitation is not visualized. No aortic stenosis is present. Pulmonic Valve: The pulmonic valve was normal in structure. Pulmonic valve regurgitation is trivial. Aorta: Aortic dilatation noted. There is mild dilatation of the ascending aorta, measuring 38 mm. Venous: The inferior vena cava is dilated in size with less than 50% respiratory variability, suggesting right atrial pressure of 15 mmHg. IAS/Shunts: There is right bowing of the interatrial septum, suggestive of elevated left atrial pressure. No atrial level shunt detected by color flow Doppler.  LEFT VENTRICLE PLAX 2D LVIDd:         6.60 cm      Diastology LVIDs:         5.70 cm      LV e' medial:    3.15 cm/s LV PW:         2.30 cm      LV E/e' medial:  35.9 LV IVS:        1.90 cm      LV e' lateral:   3.00 cm/s LVOT diam:     2.50 cm      LV E/e' lateral: 37.7 LV SV:         77 LV SV Index:   30 LVOT Area:     4.91 cm  LV Volumes (MOD) LV vol d, MOD A2C: 168.5 ml LV vol d, MOD A4C: 166.5 ml LV vol s, MOD A2C: 124.0 ml LV vol s, MOD A4C: 126.0 ml LV SV MOD A2C:     44.5 ml LV SV MOD A4C:     166.5 ml LV SV MOD BP:  49.4 ml RIGHT VENTRICLE             IVC RV S prime:     11.90 cm/s  IVC diam: 2.80 cm TAPSE (M-mode): 2.2 cm LEFT ATRIUM             Index       RIGHT ATRIUM           Index LA diam:        5.10 cm 1.97 cm/m  RA Area:     20.20 cm LA Vol (A2C):   77.7 ml 30.04 ml/m RA Volume:   61.70 ml  23.85 ml/m LA Vol (A4C):   85.5 ml 33.06 ml/m LA Biplane Vol: 81.8 ml 31.63 ml/m  AORTIC VALVE LVOT Vmax:   96.10 cm/s LVOT Vmean:  65.400 cm/s  LVOT VTI:    0.156 m  AORTA Ao Root diam: 4.10 cm Ao Asc diam:  3.80 cm MITRAL VALVE                TRICUSPID VALVE MV Area (PHT): 4.29 cm     TR Peak grad:   28.5 mmHg MV Decel Time: 177 msec     TR Vmax:        267.00 cm/s MV E velocity: 113.00 cm/s MV A velocity: 72.80 cm/s   SHUNTS MV E/A ratio:  1.55         Systemic VTI:  0.16 m                             Systemic Diam: 2.50 cm Laurance Flatten MD Electronically signed by Laurance Flatten MD Signature Date/Time: 01/05/2021/12:50:34 PM    Final       Scheduled Meds: . aspirin  81 mg Oral Daily  . carvedilol  6.25 mg Oral BID WC  . enoxaparin (LOVENOX) injection  70 mg Subcutaneous Q24H  . furosemide  40 mg Intravenous BID  . potassium chloride  40 mEq Oral BID  . rosuvastatin  40 mg Oral Daily  . sacubitril-valsartan  1 tablet Oral BID  . sodium chloride flush  3 mL Intravenous Q12H  . spironolactone  25 mg Oral Daily   Continuous Infusions: . sodium chloride       LOS: 2 days      Time spent: 15 minutes   Noralee Stain, DO [facility] 01/06/2021, 1:30 PM   Available via Epic secure chat 7am-7pm After these hours, please refer to coverage provider listed on [email or web address]

## 2021-01-06 NOTE — Progress Notes (Addendum)
Progress Note  Patient Name: Fordyce Lepak Date of Encounter: 01/06/2021  [facility] Cardiologist:New  Subjective   SOB is improving.   Inpatient Medications    Scheduled Meds: . aspirin  81 mg Oral Daily  . carvedilol  6.25 mg Oral BID WC  . enoxaparin (LOVENOX) injection  70 mg Subcutaneous Q24H  . furosemide  40 mg Intravenous Q12H  . potassium chloride  40 mEq Oral BID  . rosuvastatin  40 mg Oral Daily  . sacubitril-valsartan  1 tablet Oral BID  . sodium chloride flush  3 mL Intravenous Q12H  . spironolactone  25 mg Oral Daily   Continuous Infusions: . sodium chloride     PRN Meds: sodium chloride, acetaminophen, ondansetron (ZOFRAN) IV, sodium chloride flush   Vital Signs    Vitals:   01/06/21 0012 01/06/21 0018 01/06/21 0502 01/06/21 0730  BP: (!) 157/109  (!) 162/109 (!) 159/116  Pulse: 77  78 73  Resp: 20  19 19   Temp: 98.1 F (36.7 C)  98.6 F (37 C) 98.5 F (36.9 C)  TempSrc: Oral  Oral Oral  SpO2: 99%  97% 99%  Weight:  (!) 138.2 kg    Height:        Intake/Output Summary (Last 24 hours) at 01/06/2021 0840 Last data filed at 01/06/2021 0530 Gross per 24 hour  Intake 1081 ml  Output 2325 ml  Net -1244 ml   Last 3 Weights 01/06/2021 01/05/2021 01/05/2021  Weight (lbs) 304 lb 10.8 oz 308 lb 6.8 oz 308 lb 6.8 oz  Weight (kg) 138.2 kg 139.9 kg 139.9 kg      Telemetry    SR, 4 beat runs NSVT - Personally Reviewed  ECG    n/a - Personally Reviewed  Physical Exam   GEN: No acute distress.   Neck: No JVD Cardiac: RRR, no murmurs, rubs, or gallops.  Respiratory: Clear to auscultation bilaterally. GI: Soft, nontender, non-distended  MS: trace bilaterla edema; No deformity. Neuro:  Nonfocal  Psych: Normal affect   Labs    High Sensitivity Troponin:   Recent Labs  Lab 01/04/21 1510 01/04/21 1847  TROPONINIHS 72* 66*      Chemistry Recent Labs  Lab 01/04/21 1510 01/05/21 0623 01/06/21 0240  NA 139 137 138  K 3.4* 3.5 4.1   CL 105 106 103  CO2 28 26 25   GLUCOSE 93 95 102*  BUN 10 11 12   CREATININE 1.17 1.21 1.20  CALCIUM 8.5* 8.5* 8.6*  PROT  --  6.5  --   ALBUMIN  --  3.1*  --   AST  --  17  --   ALT  --  20  --   ALKPHOS  --  70  --   BILITOT  --  0.7  --   GFRNONAA >60 >60 >60  ANIONGAP 6 5 10      Hematology Recent Labs  Lab 01/04/21 1510 01/05/21 0623  WBC 9.5 8.3  RBC 4.70 4.56  HGB 14.2 13.7  HCT 43.6 42.0  MCV 92.8 92.1  MCH 30.2 30.0  MCHC 32.6 32.6  RDW 13.2 13.4  PLT 362 333    BNP Recent Labs  Lab 01/04/21 1510  BNP 707.6*     DDimer No results for input(s): DDIMER in the last 168 hours.   Radiology    DG Chest 2 View  Result Date: 01/04/2021 CLINICAL DATA:  Shortness of breath EXAM: CHEST - 2 VIEW COMPARISON:  May 16, 2017 FINDINGS: There  is cardiomegaly with pulmonary venous hypertension. There is interstitial edema. There is mild left midlung atelectasis. No consolidation. No adenopathy. No bone lesions. IMPRESSION: Cardiomegaly with pulmonary vascular congestion. Interstitial edema. Appearance indicative of congestive heart failure. No consolidation. Mild left midlung atelectasis. Electronically Signed   By: Lowella Grip III M.D.   On: 01/04/2021 12:30   ECHOCARDIOGRAM COMPLETE  Result Date: 01/05/2021    ECHOCARDIOGRAM REPORT   Patient Name:   [facility] Date of Exam: 01/05/2021 Medical Rec #:  [id number]     Height:       73.0 in Accession #:    [id number]    Weight:       308.4 lb Date of Birth:  01-17-1971     BSA:          2.587 m Patient Age:    50 years      BP:           153/99 mmHg Patient Gender: M             HR:           88 bpm. Exam Location:  Inpatient Procedure: 2D Echo, Cardiac Doppler, Color Doppler and Intracardiac            Opacification Agent Indications:    I50.40* Unspecified combined systolic (congestive) and diastolic                 (congestive) heart failure  History:        Patient has no prior history of Echocardiogram  examinations.                 CHF; Risk Factors:Hypertension.  Sonographer:    Roseanna Rainbow RDCS Referring Phys: [id number] [place]  Sonographer Comments: Technically difficult study due to poor echo windows and patient is morbidly obese. Image acquisition challenging due to patient body habitus. IMPRESSIONS  1. Left ventricular ejection fraction, by estimation, is 20 to 25%. The left ventricle has severely decreased function. The left ventricle demonstrates global hypokinesis. The left ventricular internal cavity size was moderately dilated. There is severe  concentric left ventricular hypertrophy. Left ventricular diastolic parameters are consistent with Grade II diastolic dysfunction (pseudonormalization). Elevated left atrial pressure.  2. Right ventricular systolic function is normal. The right ventricular size is normal. There is mildly elevated pulmonary artery systolic pressure. The estimated right ventricular systolic pressure is 70.7 mmHg.  3. The mitral valve is normal in structure. Trivial mitral valve regurgitation.  4. The aortic valve is tricuspid. Aortic valve regurgitation is not visualized. No aortic stenosis is present.  5. Aortic dilatation noted. There is mild dilatation of the ascending aorta, measuring 38 mm.  6. The inferior vena cava is dilated in size with <50% respiratory variability, suggesting right atrial pressure of 15 mmHg. Comparison(s): No prior Echocardiogram. FINDINGS  Left Ventricle: Left ventricular ejection fraction, by estimation, is 20 to 25%. The left ventricle has severely decreased function. The left ventricle demonstrates global hypokinesis. Definity contrast agent was given IV to delineate the left ventricular endocardial borders. The left ventricular internal cavity size was moderately dilated. There is severe concentric left ventricular hypertrophy. Left ventricular diastolic parameters are consistent with Grade II diastolic dysfunction (pseudonormalization). Elevated  left atrial pressure. Right Ventricle: The right ventricular size is normal. No increase in right ventricular wall thickness. Right ventricular systolic function is normal. There is mildly elevated pulmonary artery systolic pressure. The tricuspid regurgitant velocity is 2.67  m/s, and with an assumed right  atrial pressure of 15 mmHg, the estimated right ventricular systolic pressure is 91.5 mmHg. Left Atrium: Left atrial size was normal in size. Right Atrium: Right atrial size was normal in size. Pericardium: There is no evidence of pericardial effusion. Mitral Valve: The mitral valve is normal in structure. Trivial mitral valve regurgitation. Tricuspid Valve: The tricuspid valve is normal in structure. Tricuspid valve regurgitation is trivial. Aortic Valve: The aortic valve is tricuspid. Aortic valve regurgitation is not visualized. No aortic stenosis is present. Pulmonic Valve: The pulmonic valve was normal in structure. Pulmonic valve regurgitation is trivial. Aorta: Aortic dilatation noted. There is mild dilatation of the ascending aorta, measuring 38 mm. Venous: The inferior vena cava is dilated in size with less than 50% respiratory variability, suggesting right atrial pressure of 15 mmHg. IAS/Shunts: There is right bowing of the interatrial septum, suggestive of elevated left atrial pressure. No atrial level shunt detected by color flow Doppler.  LEFT VENTRICLE PLAX 2D LVIDd:         6.60 cm      Diastology LVIDs:         5.70 cm      LV e' medial:    3.15 cm/s LV PW:         2.30 cm      LV E/e' medial:  35.9 LV IVS:        1.90 cm      LV e' lateral:   3.00 cm/s LVOT diam:     2.50 cm      LV E/e' lateral: 37.7 LV SV:         77 LV SV Index:   30 LVOT Area:     4.91 cm  LV Volumes (MOD) LV vol d, MOD A2C: 168.5 ml LV vol d, MOD A4C: 166.5 ml LV vol s, MOD A2C: 124.0 ml LV vol s, MOD A4C: 126.0 ml LV SV MOD A2C:     44.5 ml LV SV MOD A4C:     166.5 ml LV SV MOD BP:      49.4 ml RIGHT VENTRICLE              IVC RV S prime:     11.90 cm/s  IVC diam: 2.80 cm TAPSE (M-mode): 2.2 cm LEFT ATRIUM             Index       RIGHT ATRIUM           Index LA diam:        5.10 cm 1.97 cm/m  RA Area:     20.20 cm LA Vol (A2C):   77.7 ml 30.04 ml/m RA Volume:   61.70 ml  23.85 ml/m LA Vol (A4C):   85.5 ml 33.06 ml/m LA Biplane Vol: 81.8 ml 31.63 ml/m  AORTIC VALVE LVOT Vmax:   96.10 cm/s LVOT Vmean:  65.400 cm/s LVOT VTI:    0.156 m  AORTA Ao Root diam: 4.10 cm Ao Asc diam:  3.80 cm MITRAL VALVE                TRICUSPID VALVE MV Area (PHT): 4.29 cm     TR Peak grad:   28.5 mmHg MV Decel Time: 177 msec     TR Vmax:        267.00 cm/s MV E velocity: 113.00 cm/s MV A velocity: 72.80 cm/s   SHUNTS MV E/A ratio:  1.55         Systemic VTI:  0.16 m  Systemic Diam: 2.50 cm Gwyndolyn Kaufman MD Electronically signed by Gwyndolyn Kaufman MD Signature Date/Time: 01/05/2021/12:50:34 PM    Final     Cardiac Studies   Echocardiogram from 01/05/21:  1. Left ventricular ejection fraction, by estimation, is 20 to 25%. The  left ventricle has severely decreased function. The left ventricle  demonstrates global hypokinesis. The left ventricular internal cavity size  was moderately dilated. There is severe  concentric left ventricular hypertrophy. Left ventricular diastolic  parameters are consistent with Grade II diastolic dysfunction  (pseudonormalization). Elevated left atrial pressure.  2. Right ventricular systolic function is normal. The right ventricular  size is normal. There is mildly elevated pulmonary artery systolic  pressure. The estimated right ventricular systolic pressure is 29.0 mmHg.  3. The mitral valve is normal in structure. Trivial mitral valve  regurgitation.  4. The aortic valve is tricuspid. Aortic valve regurgitation is not  visualized. No aortic stenosis is present.  5. Aortic dilatation noted. There is mild dilatation of the ascending  aorta, measuring 38 mm.  6.  The inferior vena cava is dilated in size with <50% respiratory  variability, suggesting right atrial pressure of 15 mmHg.   Echocardiogram to [facility] from 05/17/2017:  Moderate global LV hypokinesis.  Ejection fraction is visually estimated at 30-35%  Mild to moderate concentric left ventricular hypertrophy  Mild mitral regurgitation  No significant change vs previous echo    Cardiac catheterization at [facility] 05/16/17 for STEMI:  LAD: Acute occlusion.  Lesion on Prox LAD: Proximal subsection.100% stenosis 15 mm length reduced to 0%. Pre procedure TIMI 0 flow was noted. Post Procedure TIMI III flow was present. Poorrun off was present. The lesion was diagnosed as Moderate Risk (B). The lesion showed evidence of present thrombus. Bifurcation lesion.   Devices used  - Abbott 0.014" x 190cm BMW J-Tip PTCI Guidewire. Number of passes: 1.  - 3.5x20 Euphora. 4inflation(s) to a max pressure of: 14 atm.  - 4.0x15 Euphora. 1 inflation(s) to a max pressure of: 8 atm.  - 4.0x15 Euphora. 3 inflation(s) to a max pressure of: 10 atm.  - 4.0x20 Euphora. 2 inflation(s) to a max pressure of: 8 atm.   LMCA: 0% and Normal.  LCx: 0% and Normal.  RCA: 0% and Normal.   Successful PCI / PTCA of the proximal Left Anterior Descending Coronary   Patient Profile     Amadeo Coke is a 50 y.o. 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 Maylene Roes  Assessment & Plan    1. Acute on chronic systolic HF/ICM - 09/1113 echo [facility]: LVEF 30-35% - 12/2020 echo LVEF 20-25%, grade II dd, normal RV - noncompliant with medications since 04/2020, appears to be etiology of exacerbation and likely worsening LV systolic  dysfunction - BNP 707 on admit,m CXR piulm congestion and edema  - from charting neg 1.2 L yesterday, neg 788m since admission. He is on IV lasix 42mbid, mild uptrend in in Cr but still WNL. Weight 319?-->308-->304 lbs - medical therapy with coreg 6.2563mid, entresto 49/70m62md started today, aldactone 25mg73mly. Likely SGLT2i at discharge.   Continue IV diuresis today, remains fluid overloaded.     2. CAD - s/p BMS  to LAD in 2017,  STEMI 2018 PTCA of LAD) - trop 72-->66 in setting of HF and severe HTN, not specific for ACS - EKG SR, no specific ischemic changes - no plans for ischemic testing at this time - he is on ASA, beta blocker, ARB, statin.    3. HTN - follow with starting entresto today.   4. Hyperlipidemia LDL elevated to 150, restarted on crestor this admission.    For questions or updates, please contact [place] Please consult [email or web address] for contact info under        Signed, Carlyle Dolly, MD  01/06/2021, 8:40 AM

## 2021-01-06 NOTE — Plan of Care (Signed)
  Problem: Education: Goal: Ability to demonstrate management of disease process will improve Outcome: Progressing Goal: Ability to verbalize understanding of medication therapies will improve Outcome: Progressing   Problem: Health Behavior/Discharge Planning: Goal: Ability to manage health-related needs will improve Outcome: Progressing   Problem: Clinical Measurements: Goal: Ability to maintain clinical measurements within normal limits will improve Outcome: Progressing

## 2021-01-07 ENCOUNTER — Encounter (HOSPITAL_COMMUNITY): Payer: Self-pay | Admitting: Internal Medicine

## 2021-01-07 LAB — BRAIN NATRIURETIC PEPTIDE: B Natriuretic Peptide: 308.3 pg/mL — ABNORMAL HIGH (ref 0.0–100.0)

## 2021-01-07 LAB — BASIC METABOLIC PANEL
Anion gap: 7 (ref 5–15)
BUN: 10 mg/dL (ref 6–20)
CO2: 25 mmol/L (ref 22–32)
Calcium: 8.9 mg/dL (ref 8.9–10.3)
Chloride: 106 mmol/L (ref 98–111)
Creatinine, Ser: 1.14 mg/dL (ref 0.61–1.24)
GFR, Estimated: >60 mL/min (ref >60)
Glucose, Bld: 108 mg/dL — ABNORMAL HIGH (ref 70–99)
Potassium: 4.4 mmol/L (ref 3.5–5.1)
Sodium: 138 mmol/L (ref 135–145)

## 2021-01-07 LAB — MAGNESIUM: Magnesium: 1.9 mg/dL (ref 1.7–2.4)

## 2021-01-07 MED ORDER — POTASSIUM CHLORIDE CRYS ER 20 MEQ PO TBCR
20.0000 meq | EXTENDED_RELEASE_TABLET | Freq: Two times a day (BID) | ORAL | Status: DC
  Administered 2021-01-07: 20 meq via ORAL
  Filled 2021-01-07: qty 1

## 2021-01-07 MED ORDER — CARVEDILOL 6.25 MG PO TABS
6.2500 mg | ORAL_TABLET | Freq: Once | ORAL | Status: AC
  Administered 2021-01-07: 6.25 mg via ORAL
  Filled 2021-01-07: qty 1

## 2021-01-07 MED ORDER — CARVEDILOL 12.5 MG PO TABS
12.5000 mg | ORAL_TABLET | Freq: Two times a day (BID) | ORAL | Status: DC
  Administered 2021-01-07 – 2021-01-08 (×2): 12.5 mg via ORAL
  Filled 2021-01-07 (×2): qty 1

## 2021-01-07 NOTE — Progress Notes (Signed)
PROGRESS NOTE    Scott Dunlap  ZSW:[id number] DOB: February 16, 1971 DOA: 01/04/2021 PCP: Pcp, No     Brief Narrative:  Scott Dunlap is a 50 year old male with past medical history significant for hypertension, noncompliant with medications who presented to the hospital with shortness of breath ongoing for 2 to 3 days.  He has not been taking his blood pressure medication since December.  In the emergency department, patient was noted to have elevated blood pressure to 201/142, chest x-ray revealed pulmonary vascular congestion.  BNP was elevated at 707.  Patient was admitted for new onset congestive heart failure, hypertensive urgency.  He was given IV Lasix.  Echocardiogram revealed EF 20 to 25%, grade 2 diastolic dysfunction, global hypokinesis.  Cardiology consulted.  New events last 24 hours / Subjective: Sitting at the side of the bed.  He voices no complaints or concerns today.  He has diuresed 1.9 L last 24 hours per documentation.  Assessment & Plan:   Principal Problem:   Acute CHF (congestive heart failure) (HCC) Active Problems:   Essential hypertension   CHF (congestive heart failure) (HCC)   Hypertensive urgency   Acute combined systolic and diastolic heart failure (HCC)   Morbid obesity (HCC)   S/P coronary artery stent placement   Acute on chronic combined systolic and diastolic CHF exacerbation, history of ischemic cardiomyopathy -Echocardiogram EF 20 to 25%, global hypokinesis, severe LVH, grade 2 diastolic dysfunction -Continue IV Lasix, Coreg, Entresto, Aldactone -Strict I's and O's, daily weight, fluid restriction diet -Cardiology following  Hypertension -Medication noncompliance -Continue Lasix, Coreg, Entresto, Aldactone -Blood pressure improved  Demand ischemia CAD -Troponin 72 >> 66, secondary to above -No plans for ischemic work-up at this time -Continue aspirin, Coreg, Entresto, Aldactone  HLD -Continue crestor   Prediabetes -Hemoglobin A1c  5.7    DVT prophylaxis: Lovenox   Code Status:     Code Status Orders  (From admission, onward)         Start     Ordered   01/04/21 2059  Full code  Continuous        01/04/21 2059        Code Status History    This patient has a current code status but no historical code status.   Advance Care Planning Activity     Family Communication: None at bedside Disposition Plan:  Status is: Inpatient  Remains inpatient appropriate because:IV treatments appropriate due to intensity of illness or inability to take PO and Inpatient level of care appropriate due to severity of illness   Dispo: The patient is from: Home              Anticipated d/c is to: Home              Patient currently is not medically stable to d/c.  Remains on IV Lasix.   Difficult to place patient No      Consultants:   Cardiology  Procedures:   None   Antimicrobials:  Anti-infectives (From admission, onward)   None       Objective: Vitals:   01/06/21 1928 01/07/21 0036 01/07/21 0406 01/07/21 0819  BP: (!) 158/117 (!) 157/110 (!) 142/110 (!) 161/107  Pulse: 83 75 70 85  Resp: 17 19 17 20   Temp: 98.3 F (36.8 C) 97.9 F (36.6 C) 98.2 F (36.8 C) 98.9 F (37.2 C)  TempSrc: Oral Oral Oral Oral  SpO2: 98% 99% 94% 98%  Weight:  135.1 kg    Height:  Intake/Output Summary (Last 24 hours) at 01/07/2021 1211 Last data filed at 01/07/2021 0900 Gross per 24 hour  Intake 720 ml  Output 2125 ml  Net -1405 ml   Filed Weights   01/05/21 0500 01/06/21 0018 01/07/21 0036  Weight: (!) 139.9 kg (!) 138.2 kg 135.1 kg    Examination: General exam: Appears calm and comfortable  Respiratory system: Clear to auscultation. Respiratory effort normal. Cardiovascular system: S1 & S2 heard, RRR. No pedal edema. Gastrointestinal system: Abdomen is nondistended, soft and nontender. Normal bowel sounds heard. Central nervous system: Alert and oriented. Non focal exam. Speech clear   Extremities: Symmetric in appearance bilaterally  Skin: No rashes, lesions or ulcers on exposed skin  Psychiatry: Judgement and insight appear stable. Mood & affect appropriate.    Data Reviewed: I have personally reviewed following labs and imaging studies  CBC: Recent Labs  Lab 01/04/21 1510 01/05/21 0623  WBC 9.5 8.3  HGB 14.2 13.7  HCT 43.6 42.0  MCV 92.8 92.1  PLT 362 333   Basic Metabolic Panel: Recent Labs  Lab 01/04/21 1510 01/05/21 0623 01/06/21 0240 01/07/21 0605  NA 139 137 138 138  K 3.4* 3.5 4.1 4.4  CL 105 106 103 106  CO2 28 26 25 25   GLUCOSE 93 95 102* 108*  BUN 10 11 12 10   CREATININE 1.17 1.21 1.20 1.14  CALCIUM 8.5* 8.5* 8.6* 8.9  MG  --  1.9 1.8  --   PHOS  --  3.2  --   --    GFR: Estimated Creatinine Clearance: 111.8 mL/min (by C-G formula based on SCr of 1.14 mg/dL). Liver Function Tests: Recent Labs  Lab 01/05/21 0623  AST 17  ALT 20  ALKPHOS 70  BILITOT 0.7  PROT 6.5  ALBUMIN 3.1*   No results for input(s): LIPASE, AMYLASE in the last 168 hours. No results for input(s): AMMONIA in the last 168 hours. Coagulation Profile: No results for input(s): INR, PROTIME in the last 168 hours. Cardiac Enzymes: No results for input(s): CKTOTAL, CKMB, CKMBINDEX, TROPONINI in the last 168 hours. BNP (last 3 results) No results for input(s): PROBNP in the last 8760 hours. HbA1C: Recent Labs    01/05/21 0623  HGBA1C 5.7*   CBG: No results for input(s): GLUCAP in the last 168 hours. Lipid Profile: Recent Labs    01/05/21 0623  CHOL 218*  HDL 30*  LDLCALC 156*  TRIG 162*  CHOLHDL 7.3   Thyroid Function Tests: No results for input(s): TSH, T4TOTAL, FREET4, T3FREE, THYROIDAB in the last 72 hours. Anemia Panel: No results for input(s): VITAMINB12, FOLATE, FERRITIN, TIBC, IRON, RETICCTPCT in the last 72 hours. Sepsis Labs: No results for input(s): PROCALCITON, LATICACIDVEN in the last 168 hours.  Recent Results (from the past 240  hour(s))  Resp Panel by RT-PCR (Flu A&B, Covid) Nasopharyngeal Swab     Status: None   Collection Time: 01/04/21  2:31 PM   Specimen: Nasopharyngeal Swab; Nasopharyngeal(NP) swabs in vial transport medium  Result Value Ref Range Status   SARS Coronavirus 2 by RT PCR NEGATIVE NEGATIVE Final    Comment: (NOTE) SARS-CoV-2 target nucleic acids are NOT DETECTED.  The SARS-CoV-2 RNA is generally detectable in upper respiratory specimens during the acute phase of infection. The lowest concentration of SARS-CoV-2 viral copies this assay can detect is 138 copies/mL. A negative result does not preclude SARS-Cov-2 infection and should not be used as the sole basis for treatment or other patient management decisions. A negative result  may occur with  improper specimen collection/handling, submission of specimen other than nasopharyngeal swab, presence of viral mutation(s) within the areas targeted by this assay, and inadequate number of viral copies(<138 copies/mL). A negative result must be combined with clinical observations, patient history, and epidemiological information. The expected result is Negative.  Fact Sheet for Patients:  BloggerCourse.com  Fact Sheet for Healthcare Providers:  SeriousBroker.it  This test is no t yet approved or cleared by the [place] FDA and  has been authorized for detection and/or diagnosis of SARS-CoV-2 by FDA under an Emergency Use Authorization (EUA). This EUA will remain  in effect (meaning this test can be used) for the duration of the COVID-19 declaration under Section 564(b)(1) of the Act, 21 U.S.C.section 360bbb-3(b)(1), unless the authorization is terminated  or revoked sooner.       Influenza A by PCR NEGATIVE NEGATIVE Final   Influenza B by PCR NEGATIVE NEGATIVE Final    Comment: (NOTE) The Xpert Xpress SARS-CoV-2/FLU/RSV plus assay is intended as an aid in the diagnosis of influenza from  Nasopharyngeal swab specimens and should not be used as a sole basis for treatment. Nasal washings and aspirates are unacceptable for Xpert Xpress SARS-CoV-2/FLU/RSV testing.  Fact Sheet for Patients: BloggerCourse.com  Fact Sheet for Healthcare Providers: SeriousBroker.it  This test is not yet approved or cleared by the [place] FDA and has been authorized for detection and/or diagnosis of SARS-CoV-2 by FDA under an Emergency Use Authorization (EUA). This EUA will remain in effect (meaning this test can be used) for the duration of the COVID-19 declaration under Section 564(b)(1) of the Act, 21 U.S.C. section 360bbb-3(b)(1), unless the authorization is terminated or revoked.  Performed at [facility], [address] [place], [place] [address]       Radiology Studies: No results found.    Scheduled Meds: . aspirin  81 mg Oral Daily  . carvedilol  12.5 mg Oral BID WC  . enoxaparin (LOVENOX) injection  70 mg Subcutaneous Q24H  . furosemide  40 mg Intravenous BID  . potassium chloride  40 mEq Oral BID  . rosuvastatin  40 mg Oral Daily  . sacubitril-valsartan  1 tablet Oral BID  . sodium chloride flush  3 mL Intravenous Q12H  . spironolactone  25 mg Oral Daily   Continuous Infusions: . sodium chloride       LOS: 3 days      Time spent: 15 minutes   Noralee Stain, DO [facility] 01/07/2021, 12:11 PM   Available via Epic secure chat 7am-7pm After these hours, please refer to coverage provider listed on [email or web address]

## 2021-01-07 NOTE — Progress Notes (Signed)
Progress Note  Patient Name: Scott Dunlap Date of Encounter: 01/07/2021  [facility] Cardiologist: New  Subjective   SOB  Inpatient Medications    Scheduled Meds: . aspirin  81 mg Oral Daily  . carvedilol  6.25 mg Oral BID WC  . enoxaparin (LOVENOX) injection  70 mg Subcutaneous Q24H  . furosemide  40 mg Intravenous BID  . potassium chloride  40 mEq Oral BID  . rosuvastatin  40 mg Oral Daily  . sacubitril-valsartan  1 tablet Oral BID  . sodium chloride flush  3 mL Intravenous Q12H  . spironolactone  25 mg Oral Daily   Continuous Infusions: . sodium chloride     PRN Meds: sodium chloride, acetaminophen, ondansetron (ZOFRAN) IV, sodium chloride flush   Vital Signs    Vitals:   01/06/21 1746 01/06/21 1928 01/07/21 0036 01/07/21 0406  BP: (!) 161/116 (!) 158/117 (!) 157/110 (!) 142/110  Pulse: 76 83 75 70  Resp: 20 17 19 17   Temp: 98.8 F (37.1 C) 98.3 F (36.8 C) 97.9 F (36.6 C) 98.2 F (36.8 C)  TempSrc: Oral Oral Oral Oral  SpO2: 99% 98% 99% 94%  Weight:   135.1 kg   Height:        Intake/Output Summary (Last 24 hours) at 01/07/2021 0750 Last data filed at 01/07/2021 0038 Gross per 24 hour  Intake 480 ml  Output 1925 ml  Net -1445 ml   Last 3 Weights 01/07/2021 01/06/2021 01/05/2021  Weight (lbs) 297 lb 12.8 oz 304 lb 10.8 oz 308 lb 6.8 oz  Weight (kg) 135.081 kg 138.2 kg 139.9 kg      Telemetry    NSR - Personally Reviewed  ECG    n/a - Personally Reviewed  Physical Exam   GEN: No acute distress.   Neck: No JVD Cardiac: RRR, no murmurs, rubs, or gallops.  Respiratory: Clear to auscultation bilaterally. GI: Soft, nontender, non-distended  MS: No edema; No deformity. Neuro:  Nonfocal  Psych: Normal affect   Labs    High Sensitivity Troponin:   Recent Labs  Lab 01/04/21 1510 01/04/21 1847  TROPONINIHS 72* 66*      Chemistry Recent Labs  Lab 01/05/21 0623 01/06/21 0240 01/07/21 0605  NA 137 138 138  K 3.5 4.1 4.4  CL 106  103 106  CO2 26 25 25   GLUCOSE 95 102* 108*  BUN 11 12 10   CREATININE 1.21 1.20 1.14  CALCIUM 8.5* 8.6* 8.9  PROT 6.5  --   --   ALBUMIN 3.1*  --   --   AST 17  --   --   ALT 20  --   --   ALKPHOS 70  --   --   BILITOT 0.7  --   --   GFRNONAA >60 >60 >60  ANIONGAP 5 10 7      Hematology Recent Labs  Lab 01/04/21 1510 01/05/21 0623  WBC 9.5 8.3  RBC 4.70 4.56  HGB 14.2 13.7  HCT 43.6 42.0  MCV 92.8 92.1  MCH 30.2 30.0  MCHC 32.6 32.6  RDW 13.2 13.4  PLT 362 333    BNP Recent Labs  Lab 01/04/21 1510 01/07/21 0605  BNP 707.6* 308.3*     DDimer No results for input(s): DDIMER in the last 168 hours.   Radiology    ECHOCARDIOGRAM COMPLETE  Result Date: 01/05/2021    ECHOCARDIOGRAM REPORT   Patient Name:   JABRON WEESE Date of Exam: 01/05/2021 Medical Rec #:  [id number]     Height:       73.0 in Accession #:    [id number]    Weight:       308.4 lb Date of Birth:  29-Mar-1971     BSA:          2.587 m Patient Age:    50 years      BP:           153/99 mmHg Patient Gender: M             HR:           88 bpm. Exam Location:  Inpatient Procedure: 2D Echo, Cardiac Doppler, Color Doppler and Intracardiac            Opacification Agent Indications:    I50.40* Unspecified combined systolic (congestive) and diastolic                 (congestive) heart failure  History:        Patient has no prior history of Echocardiogram examinations.                 CHF; Risk Factors:Hypertension.  Sonographer:    Roseanna Rainbow RDCS Referring Phys: [id number] [place]  Sonographer Comments: Technically difficult study due to poor echo windows and patient is morbidly obese. Image acquisition challenging due to patient body habitus. IMPRESSIONS  1. Left ventricular ejection fraction, by estimation, is 20 to 25%. The left ventricle has severely decreased function. The left ventricle demonstrates global hypokinesis. The left ventricular internal cavity size was moderately dilated. There is severe  concentric  left ventricular hypertrophy. Left ventricular diastolic parameters are consistent with Grade II diastolic dysfunction (pseudonormalization). Elevated left atrial pressure.  2. Right ventricular systolic function is normal. The right ventricular size is normal. There is mildly elevated pulmonary artery systolic pressure. The estimated right ventricular systolic pressure is 88.5 mmHg.  3. The mitral valve is normal in structure. Trivial mitral valve regurgitation.  4. The aortic valve is tricuspid. Aortic valve regurgitation is not visualized. No aortic stenosis is present.  5. Aortic dilatation noted. There is mild dilatation of the ascending aorta, measuring 38 mm.  6. The inferior vena cava is dilated in size with <50% respiratory variability, suggesting right atrial pressure of 15 mmHg. Comparison(s): No prior Echocardiogram. FINDINGS  Left Ventricle: Left ventricular ejection fraction, by estimation, is 20 to 25%. The left ventricle has severely decreased function. The left ventricle demonstrates global hypokinesis. Definity contrast agent was given IV to delineate the left ventricular endocardial borders. The left ventricular internal cavity size was moderately dilated. There is severe concentric left ventricular hypertrophy. Left ventricular diastolic parameters are consistent with Grade II diastolic dysfunction (pseudonormalization). Elevated left atrial pressure. Right Ventricle: The right ventricular size is normal. No increase in right ventricular wall thickness. Right ventricular systolic function is normal. There is mildly elevated pulmonary artery systolic pressure. The tricuspid regurgitant velocity is 2.67  m/s, and with an assumed right atrial pressure of 15 mmHg, the estimated right ventricular systolic pressure is 02.7 mmHg. Left Atrium: Left atrial size was normal in size. Right Atrium: Right atrial size was normal in size. Pericardium: There is no evidence of pericardial effusion. Mitral Valve:  The mitral valve is normal in structure. Trivial mitral valve regurgitation. Tricuspid Valve: The tricuspid valve is normal in structure. Tricuspid valve regurgitation is trivial. Aortic Valve: The aortic valve is tricuspid. Aortic valve regurgitation is not visualized. No aortic stenosis is present. Pulmonic Valve:  The pulmonic valve was normal in structure. Pulmonic valve regurgitation is trivial. Aorta: Aortic dilatation noted. There is mild dilatation of the ascending aorta, measuring 38 mm. Venous: The inferior vena cava is dilated in size with less than 50% respiratory variability, suggesting right atrial pressure of 15 mmHg. IAS/Shunts: There is right bowing of the interatrial septum, suggestive of elevated left atrial pressure. No atrial level shunt detected by color flow Doppler.  LEFT VENTRICLE PLAX 2D LVIDd:         6.60 cm      Diastology LVIDs:         5.70 cm      LV e' medial:    3.15 cm/s LV PW:         2.30 cm      LV E/e' medial:  35.9 LV IVS:        1.90 cm      LV e' lateral:   3.00 cm/s LVOT diam:     2.50 cm      LV E/e' lateral: 37.7 LV SV:         77 LV SV Index:   30 LVOT Area:     4.91 cm  LV Volumes (MOD) LV vol d, MOD A2C: 168.5 ml LV vol d, MOD A4C: 166.5 ml LV vol s, MOD A2C: 124.0 ml LV vol s, MOD A4C: 126.0 ml LV SV MOD A2C:     44.5 ml LV SV MOD A4C:     166.5 ml LV SV MOD BP:      49.4 ml RIGHT VENTRICLE             IVC RV S prime:     11.90 cm/s  IVC diam: 2.80 cm TAPSE (M-mode): 2.2 cm LEFT ATRIUM             Index       RIGHT ATRIUM           Index LA diam:        5.10 cm 1.97 cm/m  RA Area:     20.20 cm LA Vol (A2C):   77.7 ml 30.04 ml/m RA Volume:   61.70 ml  23.85 ml/m LA Vol (A4C):   85.5 ml 33.06 ml/m LA Biplane Vol: 81.8 ml 31.63 ml/m  AORTIC VALVE LVOT Vmax:   96.10 cm/s LVOT Vmean:  65.400 cm/s LVOT VTI:    0.156 m  AORTA Ao Root diam: 4.10 cm Ao Asc diam:  3.80 cm MITRAL VALVE                TRICUSPID VALVE MV Area (PHT): 4.29 cm     TR Peak grad:   28.5 mmHg MV  Decel Time: 177 msec     TR Vmax:        267.00 cm/s MV E velocity: 113.00 cm/s MV A velocity: 72.80 cm/s   SHUNTS MV E/A ratio:  1.55         Systemic VTI:  0.16 m                             Systemic Diam: 2.50 cm Gwyndolyn Kaufman MD Electronically signed by Gwyndolyn Kaufman MD Signature Date/Time: 01/05/2021/12:50:34 PM    Final     Cardiac Studies   Echocardiogram from 01/05/21:  1. Left ventricular ejection fraction, by estimation, is 20 to 25%. The  left ventricle has severely decreased function. The left ventricle  demonstrates global hypokinesis.  The left ventricular internal cavity size  was moderately dilated. There is severe  concentric left ventricular hypertrophy. Left ventricular diastolic  parameters are consistent with Grade II diastolic dysfunction  (pseudonormalization). Elevated left atrial pressure.  2. Right ventricular systolic function is normal. The right ventricular  size is normal. There is mildly elevated pulmonary artery systolic  pressure. The estimated right ventricular systolic pressure is 56.2 mmHg.  3. The mitral valve is normal in structure. Trivial mitral valve  regurgitation.  4. The aortic valve is tricuspid. Aortic valve regurgitation is not  visualized. No aortic stenosis is present.  5. Aortic dilatation noted. There is mild dilatation of the ascending  aorta, measuring 38 mm.  6. The inferior vena cava is dilated in size with <50% respiratory  variability, suggesting right atrial pressure of 15 mmHg.   Echocardiogram to [facility] from 05/17/2017:  Moderate global LV hypokinesis.  Ejection fraction is visually estimated at 30-35%  Mild to moderate concentric left ventricular hypertrophy  Mild mitral regurgitation  No significant change vs previous echo    Cardiac catheterization at [facility] 05/16/17 for STEMI:  LAD: Acute occlusion.  Lesion on Prox LAD: Proximal subsection.100% stenosis 15 mm length reduced to 0%.  Pre procedure TIMI 0 flow was noted. Post Procedure TIMI III flow was present. Poorrun off was present. The lesion was diagnosed as Moderate Risk (B). The lesion showed evidence of present thrombus. Bifurcation lesion.   Devices used  - [employer] 0.014" x 190cm BMW J-Tip PTCI Guidewire. Number of passes: 1.  - 3.5x20 Euphora. 4inflation(s) to a max pressure of: 14 atm.  - 4.0x15 Euphora. 1 inflation(s) to a max pressure of: 8 atm.  - 4.0x15 Euphora. 3 inflation(s) to a max pressure of: 10 atm.  - 4.0x20 Euphora. 2 inflation(s) to a max pressure of: 8 atm.   LMCA: 0% and Normal.  LCx: 0% and Normal.  RCA: 0% and Normal.   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 Acute on chronic systolic HF/ICM - 12/6387 echo [facility]: LVEF 30-35% - 12/2020 echo LVEF 20-25%, grade II dd, normal RV - noncompliant with medications since 04/2020, appears to be etiology of exacerbation and likely cause for worsening LV systolic dysfunction - BNP 707 on admit,m CXR piulm congestion and edema  - from charting neg 1.4 L yesterday, neg 2.2 since admission. Weight 319?-->308-->304-->297 lbs. Based on weight change I/Os would appear incomplete. BNP 707-->308. He is on IV lasix 56m bid, mild variations in Cr without clear trend.  - medical therapy with coreg 6.256mbid, entresto 49/5198mid, aldactone 37m32mily. Increase coreg to 12.5mg 42m, plenty of room with bp's.  -Likely SGLT2i at discharge.   Continue IV diuresis today, nearing euvolemia,  likely change to oral tomorrow.  Have  patient ambulate halls with nurses and document symptoms Potential discharge tomorrow   2. CAD - s/p BMSto LAD in 2017, STEMI 2018 PTCA of LAD) - trop 72-->66 in setting of HF and severe HTN, not specific for ACS - EKG SR, no specific ischemic changes - no plans for ischemic testing at this time - he is on ASA, beta  blocker, ARB, statin.    3. HTN - elevated bp's, increasing coreg today.   4. Hyperlipidemia LDL elevated to 150, restarted on crestor this admission.   For questions or updates, please contact [place] Please consult [email or web address] for contact info under        Signed, Carlyle Dolly, MD  01/07/2021, 7:50 AM

## 2021-01-07 NOTE — Plan of Care (Signed)
  Problem: Education: Goal: Ability to demonstrate management of disease process will improve Outcome: Progressing Goal: Ability to verbalize understanding of medication therapies will improve Outcome: Progressing Goal: Individualized Educational Video(s) Outcome: Progressing   Problem: Cardiac: Goal: Ability to achieve and maintain adequate cardiopulmonary perfusion will improve Outcome: Progressing   Problem: Education: Goal: Knowledge of General Education information will improve Description Including pain rating scale, medication(s)/side effects and non-pharmacologic comfort measures Outcome: Progressing   Problem: Health Behavior/Discharge Planning: Goal: Ability to manage health-related needs will improve Outcome: Progressing   Problem: Clinical Measurements: Goal: Ability to maintain clinical measurements within normal limits will improve Outcome: Progressing Goal: Will remain free from infection Outcome: Progressing Goal: Diagnostic test results will improve Outcome: Progressing Goal: Respiratory complications will improve Outcome: Progressing Goal: Cardiovascular complication will be avoided Outcome: Progressing   Problem: Activity: Goal: Risk for activity intolerance will decrease Outcome: Progressing   Problem: Nutrition: Goal: Adequate nutrition will be maintained Outcome: Progressing   Problem: Coping: Goal: Level of anxiety will decrease Outcome: Progressing   Problem: Elimination: Goal: Will not experience complications related to bowel motility Outcome: Progressing Goal: Will not experience complications related to urinary retention Outcome: Progressing   Problem: Pain Managment: Goal: General experience of comfort will improve Outcome: Progressing   Problem: Safety: Goal: Ability to remain free from injury will improve Outcome: Progressing   Problem: Skin Integrity: Goal: Risk for impaired skin integrity will decrease Outcome:  Progressing   

## 2021-01-08 ENCOUNTER — Other Ambulatory Visit (HOSPITAL_COMMUNITY): Payer: Self-pay

## 2021-01-08 DIAGNOSIS — I2510 Atherosclerotic heart disease of native coronary artery without angina pectoris: Secondary | ICD-10-CM

## 2021-01-08 DIAGNOSIS — E7800 Pure hypercholesterolemia, unspecified: Secondary | ICD-10-CM

## 2021-01-08 LAB — BASIC METABOLIC PANEL
Anion gap: 9 (ref 5–15)
BUN: 18 mg/dL (ref 6–20)
CO2: 26 mmol/L (ref 22–32)
Calcium: 9.1 mg/dL (ref 8.9–10.3)
Chloride: 103 mmol/L (ref 98–111)
Creatinine, Ser: 1.4 mg/dL — ABNORMAL HIGH (ref 0.61–1.24)
GFR, Estimated: >60 mL/min (ref >60)
Glucose, Bld: 101 mg/dL — ABNORMAL HIGH (ref 70–99)
Potassium: 4.6 mmol/L (ref 3.5–5.1)
Sodium: 138 mmol/L (ref 135–145)

## 2021-01-08 MED ORDER — DAPAGLIFLOZIN PROPANEDIOL 10 MG PO TABS
10.0000 mg | ORAL_TABLET | Freq: Every day | ORAL | Status: DC
  Administered 2021-01-08: 10 mg via ORAL
  Filled 2021-01-08: qty 1

## 2021-01-08 MED ORDER — POTASSIUM CHLORIDE CRYS ER 20 MEQ PO TBCR
20.0000 meq | EXTENDED_RELEASE_TABLET | Freq: Every day | ORAL | 0 refills | Status: DC
  Filled 2021-01-08: qty 30, 30d supply, fill #0

## 2021-01-08 MED ORDER — ROSUVASTATIN CALCIUM 40 MG PO TABS
40.0000 mg | ORAL_TABLET | Freq: Every day | ORAL | 0 refills | Status: DC
  Filled 2021-01-08: qty 30, 30d supply, fill #0

## 2021-01-08 MED ORDER — SPIRONOLACTONE 25 MG PO TABS
25.0000 mg | ORAL_TABLET | Freq: Every day | ORAL | 0 refills | Status: DC
  Filled 2021-01-08: qty 30, 30d supply, fill #0

## 2021-01-08 MED ORDER — FUROSEMIDE 40 MG PO TABS
40.0000 mg | ORAL_TABLET | Freq: Every day | ORAL | Status: DC
  Administered 2021-01-08: 40 mg via ORAL
  Filled 2021-01-08: qty 1

## 2021-01-08 MED ORDER — ASPIRIN 81 MG PO CHEW
81.0000 mg | CHEWABLE_TABLET | Freq: Every day | ORAL | 0 refills | Status: AC
  Filled 2021-01-08: qty 30, 30d supply, fill #0

## 2021-01-08 MED ORDER — POTASSIUM CHLORIDE CRYS ER 20 MEQ PO TBCR
20.0000 meq | EXTENDED_RELEASE_TABLET | Freq: Every day | ORAL | Status: DC
  Administered 2021-01-08: 20 meq via ORAL
  Filled 2021-01-08: qty 1

## 2021-01-08 MED ORDER — SACUBITRIL-VALSARTAN 49-51 MG PO TABS
1.0000 | ORAL_TABLET | Freq: Two times a day (BID) | ORAL | 0 refills | Status: DC
  Filled 2021-01-08: qty 60, 30d supply, fill #0

## 2021-01-08 MED ORDER — CARVEDILOL 12.5 MG PO TABS
12.5000 mg | ORAL_TABLET | Freq: Two times a day (BID) | ORAL | 0 refills | Status: DC
  Filled 2021-01-08: qty 60, 30d supply, fill #0

## 2021-01-08 MED ORDER — FUROSEMIDE 40 MG PO TABS
40.0000 mg | ORAL_TABLET | Freq: Every day | ORAL | 0 refills | Status: DC
  Filled 2021-01-08: qty 30, 30d supply, fill #0

## 2021-01-08 MED ORDER — DAPAGLIFLOZIN PROPANEDIOL 10 MG PO TABS
10.0000 mg | ORAL_TABLET | Freq: Every day | ORAL | 0 refills | Status: DC
  Filled 2021-01-08: qty 30, 30d supply, fill #0

## 2021-01-08 NOTE — TOC Transition Note (Addendum)
Transition of Care [facility]) - CM/SW Discharge Note   Patient Details  Name: Scott Dunlap MRN: [id number] Date of Birth: 1971/01/01  [facility] CM/SW Contact:  Zenon Mayo, RN Phone Number: 01/08/2021, 12:07 PM   Clinical Narrative:    Patient is for dc today, he will be on entresto and farxiga.  NCM informed him of the copay amt for refills for the entresto is 50.00 and farxiga is zero dollars.  Staff RN gave patient the copay card for entresto for 10.00 , NCM informed him he could use this for his refills for the entresto when his deductibles for his medications are met. He states he may need transportation at discharge.  Final next level of care: Home/Self Care Barriers to Discharge: No Barriers Identified   Patient Goals and CMS Choice Patient states their goals for this hospitalization and ongoing recovery are:: return home   Choice offered to / list presented to : NA  Discharge Placement                       Discharge Plan and Services                  DME Agency: NA       HH Arranged: NA          Social Determinants of Health (SDOH) Interventions Food Insecurity Interventions: Intervention Not Indicated Financial Strain Interventions: Intervention Not Indicated (recently evicted 09/2020, now living with mother and paying rent to mother.) Housing Interventions: Intervention Not Indicated Social Connections Interventions: Patient Refused Transportation Interventions: Intervention Not Indicated Alcohol Brief Interventions/Follow-up: Alcohol education/Brief advice   Readmission Risk Interventions No flowsheet data found.

## 2021-01-08 NOTE — Plan of Care (Signed)
  Problem: Education: Goal: Ability to demonstrate management of disease process will improve Outcome: Adequate for Discharge Goal: Ability to verbalize understanding of medication therapies will improve Outcome: Adequate for Discharge Goal: Individualized Educational Video(s) Outcome: Adequate for Discharge   Problem: Cardiac: Goal: Ability to achieve and maintain adequate cardiopulmonary perfusion will improve Outcome: Adequate for Discharge   Problem: Education: Goal: Knowledge of General Education information will improve Description: Including pain rating scale, medication(s)/side effects and non-pharmacologic comfort measures Outcome: Adequate for Discharge   Problem: Health Behavior/Discharge Planning: Goal: Ability to manage health-related needs will improve Outcome: Adequate for Discharge   

## 2021-01-08 NOTE — Progress Notes (Signed)
D/C instructions given and reviewed. Tele and IV removed, tolerated well. Awaiting TOC med delivery. 

## 2021-01-08 NOTE — Discharge Summary (Signed)
Physician Discharge Summary  Scott Dunlap UJW:[id number] DOB: [birth year] DOA: 01/04/2021  PCP: Grayce Sessions, NP   Admit date: 01/04/2021 Discharge date: 01/08/2021  Admitted From: Home Disposition: Home  Recommendations for Outpatient Follow-up:  1. Follow up with PCP as scheduled on 6/24 2. Follow up with cardiology 3. Follow BMP to check creatinine  Discharge Condition: Stable CODE STATUS: Full code Diet recommendation: Heart healthy diet  Brief/Interim Summary: Scott Dunlap is a 50 year old male with past medical history significant for hypertension, noncompliant with medications who presented to the hospital with shortness of breath ongoing for 2 to 3 days.  He has not been taking his blood pressure medication since December.  In the emergency department, patient was noted to have elevated blood pressure to 201/142, chest x-ray revealed pulmonary vascular congestion.  BNP was elevated at 707.  Patient was admitted for new onset congestive heart failure, hypertensive urgency.  He was given IV Lasix.  Echocardiogram revealed EF 20 to 25%, grade 2 diastolic dysfunction, global hypokinesis.  Cardiology consulted.  Patient was treated with IV Lasix, started on cardiac medications including Coreg, Entresto, Aldactone.  Patient's blood pressure as well as respiratory status improved.  Subjective on day of discharge: Feeling well, he has no physical complaints on examination today.  He remained stable on room air, no peripheral edema.  Eager to go home.  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, history of ischemic cardiomyopathy -Echocardiogram  EF 20 to 25%, global hypokinesis, severe LVH, grade 2 diastolic dysfunction -Continue Lasix, Coreg, Entresto, Aldactone, Farxiga -Strict I's and O's, daily weight, fluid restriction diet -Cardiology following, follow-up as outpatient  Hypertension -Medication noncompliance -Continue Lasix, Coreg, Entresto, Aldactone -Blood pressure improved  Demand ischemia CAD -Troponin 72 >> 66, secondary to above -No plans for ischemic work-up at this time -Continue aspirin, Coreg, Entresto, Aldactone  HLD -Continue crestor   Prediabetes -Hemoglobin A1c 5.7 -Farxiga started    Discharge Instructions  Discharge Instructions    (HEART FAILURE PATIENTS) Call MD:  Anytime you have any of the following symptoms: 1) 3 pound weight gain in 24 hours or 5 pounds in 1 week 2) shortness of breath, with or without a dry hacking cough 3) swelling in the hands, feet or stomach 4) if you have to sleep on extra pillows at night in order to breathe.   Complete by: As directed    Call MD for:  difficulty breathing, headache or visual disturbances   Complete by: As directed    Call MD for:  extreme fatigue   Complete by: As directed    Call MD for:  persistant dizziness or light-headedness   Complete by: As directed    Call MD for:  persistant nausea and vomiting   Complete by: As directed    Call MD for:  severe uncontrolled pain   Complete by: As directed    Call MD for:  temperature >100.4   Complete by: As directed    Diet - low sodium heart healthy   Complete by: As directed    Discharge instructions   Complete by: As directed    You were cared for by a hospitalist during your hospital  stay. If you have any questions about your discharge medications or the care you received while you were in the hospital after you are discharged, you can call the unit and ask to speak with the hospitalist on call if the hospitalist that took care of you is not available. Once you are discharged, your primary  care physician will handle any further medical issues. Please note that NO REFILLS for any discharge medications will be authorized once you are discharged, as it is imperative that you return to your primary care physician (or establish a relationship with a primary care physician if you do not have one) for your aftercare needs so that they can reassess your need for medications and monitor your lab values.   Increase activity slowly   Complete by: As directed      Allergies as of 01/08/2021   No Known Allergies     Medication List    STOP taking these medications   hydrochlorothiazide 25 MG tablet Commonly known as: HYDRODIURIL   losartan 100 MG tablet Commonly known as: COZAAR     TAKE these medications   aspirin 81 MG chewable tablet Chew 1 tablet (81 mg total) by mouth daily. Start taking on: Jan 09, 2021   carvedilol 12.5 MG tablet Commonly known as: COREG Take 1 tablet (12.5 mg total) by mouth 2 (two) times daily with a meal.   dapagliflozin propanediol 10 MG Tabs tablet Commonly known as: FARXIGA Take 1 tablet (10 mg total) by mouth daily. Start taking on: Jan 09, 2021   furosemide 40 MG tablet Commonly known as: LASIX Take 1 tablet (40 mg total) by mouth daily. Start taking on: Jan 09, 2021   potassium chloride SA 20 MEQ tablet Commonly known as: KLOR-CON Take 1 tablet (20 mEq total) by mouth daily. Start taking on: Jan 09, 2021   rosuvastatin 40 MG tablet Commonly known as: CRESTOR Take 1 tablet (40 mg total) by mouth daily. Start taking on: Jan 09, 2021   sacubitril-valsartan 49-51 MG Commonly known as: ENTRESTO Take 1 tablet by mouth 2 (two) times daily.   spironolactone 25 MG tablet Commonly known as: ALDACTONE Take 1 tablet (25 mg total) by mouth daily. Start taking on: Jan 09, 2021       Follow-up Information    Grayce Sessions, NP Follow up.   Specialty: Internal Medicine Why: Hospital follow up scheduled for Friday 02/09/2021 at  9:00am Contact information: [id number] [place] [address] [phone]              No Known Allergies  Consultations:  Cardiology    Procedures/Studies: DG Chest 2 View  Result Date: 01/04/2021 CLINICAL DATA:  Shortness of breath EXAM: CHEST - 2 VIEW COMPARISON:  May 16, 2017 FINDINGS: There is cardiomegaly with pulmonary venous hypertension. There is interstitial edema. There is mild left midlung atelectasis. No consolidation. No adenopathy. No bone lesions. IMPRESSION: Cardiomegaly with pulmonary vascular congestion. Interstitial edema. Appearance indicative of congestive heart failure. No consolidation. Mild left midlung atelectasis. Electronically Signed   By: Bretta Bang III M.D.   On: 01/04/2021 12:30   ECHOCARDIOGRAM COMPLETE  Result Date: 01/05/2021    ECHOCARDIOGRAM REPORT   Patient Name:   ANTONIE BORJON Date of Exam: 01/05/2021 Medical Rec #:  [id number]     Height:       73.0 in Accession #:    [id number]    Weight:       308.4 lb Date of Birth:  November 22, 1970     BSA:          2.587 m Patient Age:    49 years      BP:           153/99 mmHg Patient Gender: M             HR:           88 bpm. Exam Location:  Inpatient Procedure: 2D Echo, Cardiac Doppler, Color Doppler and Intracardiac            Opacification Agent Indications:    I50.40* Unspecified combined systolic (congestive) and diastolic                 (congestive) heart failure  History:        Patient has no prior history of Echocardiogram examinations.                 CHF; Risk Factors:Hypertension.  Sonographer:    Sheralyn Boatmanina West RDCS Referring Phys: [id number] [facility]  Sonographer Comments: Technically difficult study due to poor echo windows and patient is morbidly obese. Image acquisition challenging due to patient body habitus. IMPRESSIONS  1. Left ventricular ejection fraction, by estimation, is 20 to 25%. The left ventricle has severely decreased function. The left ventricle demonstrates global  hypokinesis. The left ventricular internal cavity size was moderately dilated. There is severe  concentric left ventricular hypertrophy. Left ventricular diastolic parameters are consistent with Grade II diastolic dysfunction (pseudonormalization). Elevated left atrial pressure.  2. Right ventricular systolic function is normal. The right ventricular size is normal. There is mildly elevated pulmonary artery systolic pressure. The estimated right ventricular systolic pressure is 43.5 mmHg.  3. The mitral valve is normal in structure. Trivial mitral valve regurgitation.  4. The aortic valve is tricuspid. Aortic valve regurgitation is not visualized. No aortic stenosis is present.  5. Aortic dilatation noted. There is mild dilatation of the ascending aorta, measuring 38 mm.  6. The inferior vena cava is dilated in size with <50% respiratory variability, suggesting right atrial pressure of 15 mmHg. Comparison(s): No prior Echocardiogram. FINDINGS  Left Ventricle: Left ventricular ejection fraction, by estimation, is 20 to 25%. The left ventricle has severely decreased function. The left ventricle demonstrates global hypokinesis. Definity contrast agent was given IV to delineate the left ventricular endocardial borders. The left ventricular internal cavity size was moderately dilated. There is severe concentric left ventricular hypertrophy. Left ventricular diastolic parameters are consistent with Grade II diastolic dysfunction (pseudonormalization). Elevated left atrial pressure. Right Ventricle: The right ventricular size is normal. No increase in right ventricular wall thickness. Right ventricular systolic function is normal. There is mildly elevated pulmonary artery systolic pressure. The tricuspid regurgitant velocity is 2.67  m/s, and with an assumed right atrial pressure of 15 mmHg, the estimated right ventricular systolic pressure is 43.5 mmHg. Left Atrium: Left atrial size was normal in size. Right Atrium: Right  atrial size was normal in size. Pericardium: There is no evidence of pericardial effusion. Mitral Valve: The mitral valve is normal in structure. Trivial mitral valve regurgitation. Tricuspid Valve: The tricuspid valve is normal in structure. Tricuspid valve regurgitation is trivial. Aortic Valve: The aortic valve is tricuspid. Aortic valve regurgitation is not visualized. No aortic stenosis is present. Pulmonic Valve: The pulmonic valve was normal in structure. Pulmonic valve regurgitation is trivial. Aorta: Aortic dilatation noted. There is mild dilatation of the ascending aorta, measuring 38 mm. Venous: The inferior vena cava is dilated in size  with less than 50% respiratory variability, suggesting right atrial pressure of 15 mmHg. IAS/Shunts: There is right bowing of the interatrial septum, suggestive of elevated left atrial pressure. No atrial level shunt detected by color flow Doppler.  LEFT VENTRICLE PLAX 2D LVIDd:         6.60 cm      Diastology LVIDs:         5.70 cm      LV e' medial:    3.15 cm/s LV PW:         2.30 cm      LV E/e' medial:  35.9 LV IVS:        1.90 cm      LV e' lateral:   3.00 cm/s LVOT diam:     2.50 cm      LV E/e' lateral: 37.7 LV SV:         77 LV SV Index:   30 LVOT Area:     4.91 cm  LV Volumes (MOD) LV vol d, MOD A2C: 168.5 ml LV vol d, MOD A4C: 166.5 ml LV vol s, MOD A2C: 124.0 ml LV vol s, MOD A4C: 126.0 ml LV SV MOD A2C:     44.5 ml LV SV MOD A4C:     166.5 ml LV SV MOD BP:      49.4 ml RIGHT VENTRICLE             IVC RV S prime:     11.90 cm/s  IVC diam: 2.80 cm TAPSE (M-mode): 2.2 cm LEFT ATRIUM             Index       RIGHT ATRIUM           Index LA diam:        5.10 cm 1.97 cm/m  RA Area:     20.20 cm LA Vol (A2C):   77.7 ml 30.04 ml/m RA Volume:   61.70 ml  23.85 ml/m LA Vol (A4C):   85.5 ml 33.06 ml/m LA Biplane Vol: 81.8 ml 31.63 ml/m  AORTIC VALVE LVOT Vmax:   96.10 cm/s LVOT Vmean:  65.400 cm/s LVOT VTI:    0.156 m  AORTA Ao Root diam: 4.10 cm Ao Asc diam:  3.80  cm MITRAL VALVE                TRICUSPID VALVE MV Area (PHT): 4.29 cm     TR Peak grad:   28.5 mmHg MV Decel Time: 177 msec     TR Vmax:        267.00 cm/s MV E velocity: 113.00 cm/s MV A velocity: 72.80 cm/s   SHUNTS MV E/A ratio:  1.55         Systemic VTI:  0.16 m                             Systemic Diam: 2.50 cm Laurance Flatten MD Electronically signed by Laurance Flatten MD Signature Date/Time: 01/05/2021/12:50:34 PM    Final        Discharge Exam: Vitals:   01/08/21 0409 01/08/21 0813  BP: (!) 143/108 (!) 134/114  Pulse: 76 81  Resp: 16 17  Temp: 98.6 F (37 C) 98.7 F (37.1 C)  SpO2: 95% 98%    General: Pt is alert, awake, not in acute distress Cardiovascular: RRR, S1/S2 +, no edema Respiratory: CTA bilaterally, no wheezing, no rhonchi, no respiratory distress, no conversational  dyspnea  Abdominal: Soft, NT, ND, bowel sounds + Extremities: no edema, no cyanosis Psych: Normal mood and affect, stable judgement and insight     The results of significant diagnostics from this hospitalization (including imaging, microbiology, ancillary and laboratory) are listed below for reference.     Microbiology: Recent Results (from the past 240 hour(s))  Resp Panel by RT-PCR (Flu A&B, Covid) Nasopharyngeal Swab     Status: None   Collection Time: 01/04/21  2:31 PM   Specimen: Nasopharyngeal Swab; Nasopharyngeal(NP) swabs in vial transport medium  Result Value Ref Range Status   SARS Coronavirus 2 by RT PCR NEGATIVE NEGATIVE Final    Comment: (NOTE) SARS-CoV-2 target nucleic acids are NOT DETECTED.  The SARS-CoV-2 RNA is generally detectable in upper respiratory specimens during the acute phase of infection. The lowest concentration of SARS-CoV-2 viral copies this assay can detect is 138 copies/mL. A negative result does not preclude SARS-Cov-2 infection and should not be used as the sole basis for treatment or other patient management decisions. A negative result may occur with   improper specimen collection/handling, submission of specimen other than nasopharyngeal swab, presence of viral mutation(s) within the areas targeted by this assay, and inadequate number of viral copies(<138 copies/mL). A negative result must be combined with clinical observations, patient history, and epidemiological information. The expected result is Negative.  Fact Sheet for Patients:  BloggerCourse.com  Fact Sheet for Healthcare Providers:  SeriousBroker.it  This test is no t yet approved or cleared by the [place] FDA and  has been authorized for detection and/or diagnosis of SARS-CoV-2 by FDA under an Emergency Use Authorization (EUA). This EUA will remain  in effect (meaning this test can be used) for the duration of the COVID-19 declaration under Section 564(b)(1) of the Act, 21 U.S.C.section 360bbb-3(b)(1), unless the authorization is terminated  or revoked sooner.       Influenza A by PCR NEGATIVE NEGATIVE Final   Influenza B by PCR NEGATIVE NEGATIVE Final    Comment: (NOTE) The Xpert Xpress SARS-CoV-2/FLU/RSV plus assay is intended as an aid in the diagnosis of influenza from Nasopharyngeal swab specimens and should not be used as a sole basis for treatment. Nasal washings and aspirates are unacceptable for Xpert Xpress SARS-CoV-2/FLU/RSV testing.  Fact Sheet for Patients: BloggerCourse.com  Fact Sheet for Healthcare Providers: SeriousBroker.it  This test is not yet approved or cleared by the [place] FDA and has been authorized for detection and/or diagnosis of SARS-CoV-2 by FDA under an Emergency Use Authorization (EUA). This EUA will remain in effect (meaning this test can be used) for the duration of the COVID-19 declaration under Section 564(b)(1) of the Act, 21 U.S.C. section 360bbb-3(b)(1), unless the authorization is terminated  or revoked.  Performed at [facility], [address], [place], [place] [address]      Labs: BNP (last 3 results) Recent Labs    01/04/21 1510 01/07/21 0605  BNP 707.6* 308.3*   Basic Metabolic Panel: Recent Labs  Lab 01/04/21 1510 01/05/21 0623 01/06/21 0240 01/07/21 0605 01/08/21 0726  NA 139 137 138 138 138  K 3.4* 3.5 4.1 4.4 4.6  CL 105 106 103 106 103  CO2 28 26 25 25 26   GLUCOSE 93 95 102* 108* 101*  BUN 10 11 12 10 18   CREATININE 1.17 1.21 1.20 1.14 1.40*  CALCIUM 8.5* 8.5* 8.6* 8.9 9.1  MG  --  1.9 1.8 1.9  --   PHOS  --  3.2  --   --   --  Liver Function Tests: Recent Labs  Lab 01/05/21 0623  AST 17  ALT 20  ALKPHOS 70  BILITOT 0.7  PROT 6.5  ALBUMIN 3.1*   No results for input(s): LIPASE, AMYLASE in the last 168 hours. No results for input(s): AMMONIA in the last 168 hours. CBC: Recent Labs  Lab 01/04/21 1510 01/05/21 0623  WBC 9.5 8.3  HGB 14.2 13.7  HCT 43.6 42.0  MCV 92.8 92.1  PLT 362 333   Cardiac Enzymes: No results for input(s): CKTOTAL, CKMB, CKMBINDEX, TROPONINI in the last 168 hours. BNP: Invalid input(s): POCBNP CBG: No results for input(s): GLUCAP in the last 168 hours. D-Dimer No results for input(s): DDIMER in the last 72 hours. Hgb A1c No results for input(s): HGBA1C in the last 72 hours. Lipid Profile No results for input(s): CHOL, HDL, LDLCALC, TRIG, CHOLHDL, LDLDIRECT in the last 72 hours. Thyroid function studies No results for input(s): TSH, T4TOTAL, T3FREE, THYROIDAB in the last 72 hours.  Invalid input(s): FREET3 Anemia work up No results for input(s): VITAMINB12, FOLATE, FERRITIN, TIBC, IRON, RETICCTPCT in the last 72 hours. Urinalysis No results found for: COLORURINE, APPEARANCEUR, LABSPEC, PHURINE, GLUCOSEU, HGBUR, BILIRUBINUR, KETONESUR, PROTEINUR, UROBILINOGEN, NITRITE, LEUKOCYTESUR Sepsis Labs Invalid input(s): PROCALCITONIN,  WBC,  LACTICIDVEN Microbiology Recent Results (from the past  240 hour(s))  Resp Panel by RT-PCR (Flu A&B, Covid) Nasopharyngeal Swab     Status: None   Collection Time: 01/04/21  2:31 PM   Specimen: Nasopharyngeal Swab; Nasopharyngeal(NP) swabs in vial transport medium  Result Value Ref Range Status   SARS Coronavirus 2 by RT PCR NEGATIVE NEGATIVE Final    Comment: (NOTE) SARS-CoV-2 target nucleic acids are NOT DETECTED.  The SARS-CoV-2 RNA is generally detectable in upper respiratory specimens during the acute phase of infection. The lowest concentration of SARS-CoV-2 viral copies this assay can detect is 138 copies/mL. A negative result does not preclude SARS-Cov-2 infection and should not be used as the sole basis for treatment or other patient management decisions. A negative result may occur with  improper specimen collection/handling, submission of specimen other than nasopharyngeal swab, presence of viral mutation(s) within the areas targeted by this assay, and inadequate number of viral copies(<138 copies/mL). A negative result must be combined with clinical observations, patient history, and epidemiological information. The expected result is Negative.  Fact Sheet for Patients:  BloggerCourse.com  Fact Sheet for Healthcare Providers:  SeriousBroker.it  This test is no t yet approved or cleared by the [place] FDA and  has been authorized for detection and/or diagnosis of SARS-CoV-2 by FDA under an Emergency Use Authorization (EUA). This EUA will remain  in effect (meaning this test can be used) for the duration of the COVID-19 declaration under Section 564(b)(1) of the Act, 21 U.S.C.section 360bbb-3(b)(1), unless the authorization is terminated  or revoked sooner.       Influenza A by PCR NEGATIVE NEGATIVE Final   Influenza B by PCR NEGATIVE NEGATIVE Final    Comment: (NOTE) The Xpert Xpress SARS-CoV-2/FLU/RSV plus assay is intended as an aid in the diagnosis of influenza  from Nasopharyngeal swab specimens and should not be used as a sole basis for treatment. Nasal washings and aspirates are unacceptable for Xpert Xpress SARS-CoV-2/FLU/RSV testing.  Fact Sheet for Patients: BloggerCourse.com  Fact Sheet for Healthcare Providers: SeriousBroker.it  This test is not yet approved or cleared by the [place] FDA and has been authorized for detection and/or diagnosis of SARS-CoV-2 by FDA under an Emergency Use Authorization (EUA). This  EUA will remain in effect (meaning this test can be used) for the duration of the COVID-19 declaration under Section 564(b)(1) of the Act, 21 U.S.C. section 360bbb-3(b)(1), unless the authorization is terminated or revoked.  Performed at [facility], [address], [place], [place] [address]      Patient was seen and examined on the day of discharge and was found to be in stable condition. Time coordinating discharge: 20 minutes including assessment and coordination of care, as well as examination of the patient.   SIGNED:  Noralee Stain, DO [facility] 01/08/2021, 10:36 AM

## 2021-01-08 NOTE — Progress Notes (Signed)
Progress Note  Patient Name: Scott Dunlap Date of Encounter: 01/08/2021  [facility] Cardiologist: Skeet Latch, MD   Subjective   Doing well, breathing at baseline. Discussed medication changes today. He would very much like to go home later today, as he has personal and financial matters that need to be attended to. No chest pain, tolerating medications.  Inpatient Medications    Scheduled Meds: . aspirin  81 mg Oral Daily  . carvedilol  12.5 mg Oral BID WC  . dapagliflozin propanediol  10 mg Oral Daily  . enoxaparin (LOVENOX) injection  70 mg Subcutaneous Q24H  . furosemide  40 mg Oral Daily  . potassium chloride  20 mEq Oral Daily  . rosuvastatin  40 mg Oral Daily  . sacubitril-valsartan  1 tablet Oral BID  . sodium chloride flush  3 mL Intravenous Q12H  . spironolactone  25 mg Oral Daily   Continuous Infusions: . sodium chloride     PRN Meds: sodium chloride, acetaminophen, ondansetron (ZOFRAN) IV, sodium chloride flush   Vital Signs    Vitals:   01/07/21 1925 01/08/21 0027 01/08/21 0409 01/08/21 0813  BP: (!) 157/116 (!) 147/101 (!) 143/108 (!) 134/114  Pulse: 89 74 76 81  Resp: _0 Temp: 99.1 F (37.3 C) 98.9 F (37.2 C) 98.6 F (37 C) 98.7 F (37.1 C)  TempSrc: Oral Oral Oral Oral  SpO2: 96% 95% 95% 98%  Weight:  134.8 kg    Height:        Intake/Output Summary (Last 24 hours) at 01/08/2021 0841 Last data filed at 01/08/2021 0814 Gross per 24 hour  Intake 960 ml  Output 2000 ml  Net -1040 ml   Last 3 Weights 01/08/2021 01/07/2021 01/06/2021  Weight (lbs) 297 lb 3.2 oz 297 lb 12.8 oz 304 lb 10.8 oz  Weight (kg) 134.809 kg 135.081 kg 138.2 kg      Telemetry    NSR, with one <68mn episode of SVT yesterday afternoon- Personally Reviewed  ECG    No new since 5/19 - Personally Reviewed  Physical Exam   GEN: No acute distress.   Neck: No JVD Cardiac: RRR, no murmurs, rubs, or gallops.  Respiratory: Clear to auscultation  bilaterally. GI: Soft, nontender, non-distended  MS: No edema; No deformity. Neuro:  Nonfocal  Psych: Normal affect   Labs    High Sensitivity Troponin:   Recent Labs  Lab 01/04/21 1510 01/04/21 1847  TROPONINIHS 72* 66*      Chemistry Recent Labs  Lab 01/05/21 0623 01/06/21 0240 01/07/21 0605  NA 137 138 138  K 3.5 4.1 4.4  CL 106 103 106  CO2 _1 GLUCOSE 95 102* 108*  BUN _2 CREATININE 1.21 1.20 1.14  CALCIUM 8.5* 8.6* 8.9  PROT 6.5  --   --   ALBUMIN 3.1*  --   --   AST 17  --   --   ALT 20  --   --   ALKPHOS 70  --   --   BILITOT 0.7  --   --   GFRNONAA >60 >60 >60  ANIONGAP _3 Hematology Recent Labs  Lab 01/04/21 1510 01/05/21 0623  WBC 9.5 8.3  RBC 4.70 4.56  HGB 14.2 13.7  HCT 43.6 42.0  MCV 92.8 92.1  MCH 30.2 30.0  MCHC 32.6 32.6  RDW 13.2 13.4  PLT 362 333    BNP Recent Labs  Lab 01/04/21 1510 01/07/21 0605  BNP 707.6* 308.3*     DDimer No results for input(s): DDIMER in the last 168 hours.   Radiology    No results found.  Cardiac Studies   Echocardiogram from 01/05/21:  1. Left ventricular ejection fraction, by estimation, is 20 to 25%. The  left ventricle has severely decreased function. The left ventricle  demonstrates global hypokinesis. The left ventricular internal cavity size  was moderately dilated. There is severe  concentric left ventricular hypertrophy. Left ventricular diastolic  parameters are consistent with Grade II diastolic dysfunction  (pseudonormalization). Elevated left atrial pressure.  2. Right ventricular systolic function is normal. The right ventricular  size is normal. There is mildly elevated pulmonary artery systolic  pressure. The estimated right ventricular systolic pressure is 09.3 mmHg.  3. The mitral valve is normal in structure. Trivial mitral valve  regurgitation.  4. The aortic valve is tricuspid. Aortic valve regurgitation is not  visualized. No aortic  stenosis is present.  5. Aortic dilatation noted. There is mild dilatation of the ascending  aorta, measuring 38 mm.  6. The inferior vena cava is dilated in size with <50% respiratory  variability, suggesting right atrial pressure of 15 mmHg.   Echocardiogram to [facility] from 05/17/2017:  Moderate global LV hypokinesis.  Ejection fraction is visually estimated at 30-35%  Mild to moderate concentric left ventricular hypertrophy  Mild mitral regurgitation  No significant change vs previous echo    Cardiac catheterization at [facility] 05/16/17 for STEMI:  LAD: Acute occlusion.  Lesion on Prox LAD: Proximal subsection.100% stenosis 15 mm length reduced to 0%. Pre procedure TIMI 0 flow was noted. Post Procedure TIMI III flow was present. Poorrun off was present. The lesion was diagnosed as Moderate Risk (B). The lesion showed evidence of present thrombus. Bifurcation lesion.   Devices used  - Abbott 0.014" x 190cm BMW J-Tip PTCI Guidewire. Number of passes: 1.  - 3.5x20 Euphora. 4inflation(s) to a max pressure of: 14 atm.  - 4.0x15 Euphora. 1 inflation(s) to a max pressure of: 8 atm.  - 4.0x15 Euphora. 3 inflation(s) to a max pressure of: 10 atm.  - 4.0x20 Euphora. 2 inflation(s) to a max pressure of: 8 atm.   LMCA: 0% and Normal.  LCx: 0% and Normal.  RCA: 0% and Normal.   Successful PCI / PTCA of the proximal Left Anterior Descending Coronary  Patient Profile     50 y.o. 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%,hypertension, hyperlipidemia, noncompliance, prediabetes,who is being seen5/20/2022for the evaluation of decompensated heart failureat the request ofDr.Choi  Assessment & Plan    Acute on chronic systolic heart failure -labeled as ischemia cardiomyopathy, but given global hypokinesis and presence of CAD only in LAD  territory, suspect this is at least also partially nonischemic cardiomyopathy -medication nonadherence suspected as etiology of current admission -admission weight 139.9 kg, today's weight 134.8 kg, charted net negative 3.5 L (suspect this is an incomplete picture) -on entresto 49-51 mg BID, spironolactone 25 mg daily, carvedilol 12.5 mg BID -Cr stable at 1.14 today -will start farxiga today, transition to oral diuretics (change from IV lasix 40 BID and 20 mEq potassium BID to 40 mg oral lasix daily and 20 mEq potassium daily) -appreciate HF pharmacy assistance -we discussed staying one more day in the hospital due to med changes today. He would prefer to go home. We  will leave recommendations for discharge today. -reviewed importance of daily weights at discharge.  Hypertension: -remains above goal this AM. Monitor after morning meds given -on carvedilol, entresto, spironolactone as above. Changing to oral lasix and starting farxiga as well  CAD with history of LAD STEMI (no other CAD noted on cath) Demand ischemia due to heart failure -continue aspirin, rosuvastatin  Hypercholesterolemia -continue rosuvastatin. LDL goal <70, above goal this admission, recheck as an outpatient  [place] will sign off.   Medication Recommendations:  Continue as ordered Other recommendations (labs, testing, etc):  Will need BMET at follow up Follow up as an outpatient:  Has appt scheduled with [facility]. We will make an appointment to follow up with Dr. Oval Linsey as well.  For questions or updates, please contact [place] Please consult [email or web address] for contact info under        Signed, Buford Dresser, MD  01/08/2021, 8:41 AM

## 2021-01-08 NOTE — Progress Notes (Signed)
   01/08/21 0900  Clinical Encounter Type  Visited With Patient  Visit Type Initial  Referral From Nurse  Consult/Referral To Chaplain  Spiritual Encounters  Spiritual Needs Literature  The chaplain responded to the page for spiritual consultation. Patient desires an Scientist, water quality. The chaplain provided education for the patient. The patient will discuss the documentation with his daughter and will decide how he will move forward. The chaplain spoke with the patient about his health, and he is hoping to be released today. The patient has a great since of humor. The chaplain will respond as needed.

## 2021-01-08 NOTE — Progress Notes (Signed)
Heart Failure Stewardship Pharmacist Progress Note   PCP: Pcp, No PCP-Cardiologist: Chilton Si, MD    HPI:  50 yo M with PMH of CAD, ischemic cardiomyopathy, HTN, HLD, medication noncompliance, and prediabetes. He presented to the ED on 01/04/21 with shortness of breath and LE edema. CXR on admission with cardiomegaly and pulmonary vascular congestion with interstitial edema. An ECHO was done on 01/05/21 and LVEF was 20-25%, G2DD, RV systolic function normal.   Discharge HF Medications: Furosemide 40 mg daily Carvedilol 12.5 mg BID Entresto 49/51 mg BID Spironolactone 25 mg daily Farxiga 10 mg daily  Prior to admission HF Medications: None - off all medications since December 2021  Pertinent Lab Values: . Serum creatinine 1.40, BUN 18, Potassium 4.6, Sodium 138, BNP 707.6, Magnesium 1.9   Vital Signs: . Weight: 297 lbs (admission weight: 320 lbs - estimated) . Blood pressure: 130-140/100s  . Heart rate: 70-80s   Medication Assistance / Insurance Benefits Check: Does the patient have prescription insurance?  Yes Type of insurance plan: Rx [employer] - commercial insurance  Outpatient Pharmacy:  Prior to admission outpatient pharmacy: [facility] Is the patient willing to use [facility] TOC pharmacy at discharge? Yes Is the patient willing to transition their outpatient pharmacy to utilize a [facility] outpatient pharmacy?   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 NYHA class II symptoms. - Continue furosemide 40 mg daily at discharge - Continue carvedilol 12.5 mg BID - Continue Entresto 49/51 mg BID - Continue spironolactone 25 mg daily - Agree with starting Farxiga 10 mg daily  2) Patient assistance: - Entresto copay $50 per month - can enroll in copay card to lower to $10 per 1-3 month supply - Farxiga copay $0 per month  3)  Education  - Patient  has been educated on current HF medications and potential additions to HF medication regimen  - Patient verbalizes understanding that over the next few months, these medication doses may change and more medications may be added to optimize HF regimen - Patient has been educated on basic disease state pathophysiology and goals of therapy  Sharen Hones, PharmD, BCPS Heart Failure Stewardship Pharmacist Phone [phone]

## 2021-01-09 ENCOUNTER — Telehealth: Payer: Self-pay

## 2021-01-09 ENCOUNTER — Telehealth (HOSPITAL_COMMUNITY): Payer: Self-pay | Admitting: Licensed Clinical Social Worker

## 2021-01-09 NOTE — Telephone Encounter (Signed)
Transition Care Management Follow-up Telephone Call  Date of discharge and from where: 01/08/2021, [facility]   How have you been since you were released from the hospital? He said he is feeling pretty good.   Any questions or concerns? No  Items Reviewed:  Did the pt receive and understand the discharge instructions provided? Yes   Medications obtained and verified? Yes  - he said he has all of the medications and did not have any questions about the med regime.  Other? No   Any new allergies since your discharge? No   Do you have support at home? Yes  - currently lives with his mother  Home Care and Equipment/Supplies: Were home health services ordered? no If so, what is the name of the agency? n/a  Has the agency set up a time to come to the patient's home? not applicable Were any new equipment or medical supplies ordered?  No What is the name of the medical supply agency? n/a Were you able to get the supplies/equipment? not applicable Do you have any questions related to the use of the equipment or supplies? No  Functional Questionnaire: (I = Independent and D = Dependent) ADLs: independent  Follow up appointments reviewed:   PCP Hospital f/u appt confirmed? Yes  Gwinda Passe, NP - 02/09/2021. This is his first appointment at [facility]. He did not want to schedule an appointment to be seen sooner .  Specialist Hospital f/u appt confirmed? Yes  - cardiology 01/16/2021.   Are transportation arrangements needed? No   If their condition worsens, is the pt aware to call PCP or go to the Emergency Dept.? Yes  Was the patient provided with contact information for the PCP's office or ED? Yes  Was to pt encouraged to call back with questions or concerns? Yes

## 2021-01-09 NOTE — Telephone Encounter (Signed)
CSW consulted to speak with pt regarding housing concerns by inpatient HF TOC CSW.  CSW reached out to pt who confirms he is wanting to find his own place.  Is currently staying with his mom but does not think this is a good living environment.  Pt is working and thinks he could afford up to $1000/month for rent.  He is concerned about finding housing however due to a poor credit score.  CSW completed housing search through [employer] and filtered for properties that do not require credit checks.  Sent this along with other housing options for pt to inquire with to pt email.  Pt reports no other needs at this time.  CSW will continue to follow and assist as needed  Burna Sis, LCSW Clinical Social Worker Advanced [facility] Desk#: [phone] Cell#: [phone]

## 2021-01-11 NOTE — Progress Notes (Signed)
[facility] Transitions of Care Clinic  PCP: Gwinda Passe Primary Cardiologist: Chilton Si  HPI:  Scott Dunlap is a 50 y.o.  male  with a PMH significant for coronary artery disease, ischemic cardiomyopathy, hypertension and hyperlipidemia, poor medication adherence  NSTEMI in 2017 with BMS to LAD, STEMI 2018 PTCA of LAD.  He has a history of noncompliance.  He had an echocardiogram in 2018 which showed an EF of 30-35%. He didn't follow up to have this repeated when ordered last year.   [place] [facility], BP 180/120, only taking one of his meds, hctz at that time .  BP meds restarted with 2 week fu arranged but pt never followed up.   01/04/21 patient presented to [facility] ED, blood pressure 201/142, chest x-ray revealed pulmonary vascular congestion. BNP was elevated at 707. Patient was admitted for new onset congestive heart failure, hypertensive urgency. He was given IV Lasix. Echocardiogram revealed EF 20 to 25%, grade 2 diastolic dysfunction, global hypokinesis. Cardiology consulted.  Continued on IV Lasix, started on GDMT Coreg, Entresto, Aldactone, farxiga. Diuresed 308lbs > 297lbs.  Discharge Cr 1.4 from 1.17 on admission, K 4.6.    Says he feels a lot better.  Now walks to his car without getting short of breath, also able to walk his dog around the neighborhood and denies dyspnea.  No chest pain, orthopnea can sleep flat now.  Took his medications around 7:30 am today.  Not weighing himself at home, doesn't have a scale.  Cutting back on salt.     ROS: All systems negative except as listed in HPI, PMH and Problem List.  SH:  Social History   Socioeconomic History  . Marital status: Legally Separated    Spouse name: Not on file  . Number of children: 1  . Years of education: Not on file  . Highest education level: Some college, no degree  Occupational History  . Occupation: clamp truck Designer, television/film set  Tobacco Use  .  Smoking status: Never Smoker  . Smokeless tobacco: Never Used  Vaping Use  . Vaping Use: Never used  Substance and Sexual Activity  . Alcohol use: Yes    Alcohol/week: 25.0 standard drinks    Types: 25 Shots of liquor per week    Comment: 5 shots every other day of vodka  . Drug use: Yes    Frequency: 14.0 times per week    Types: Marijuana  . Sexual activity: Not on file  Other Topics Concern  . Not on file  Social History Narrative  . Not on file   Social Determinants of Health   Financial Resource Strain: Low Risk   . Difficulty of Paying Living Expenses: Not very hard  Food Insecurity: No Food Insecurity  . Worried About Programme researcher, broadcasting/film/video in the Last Year: Never true  . Ran Out of Food in the Last Year: Never true  Transportation Needs: No Transportation Needs  . Lack of Transportation (Medical): No  . Lack of Transportation (Non-Medical): No  Physical Activity: Not on file  Stress: Not on file  Social Connections: Socially Isolated  . Frequency of Communication with Friends and Family: Twice a week  . Frequency of Social Gatherings with Friends and Family: Never  . Attends Religious Services: Never  . Active Member of Clubs or Organizations: No  . Attends Banker Meetings: Never  . Marital Status: Separated  Intimate Partner Violence: Not on file    FH:  No family history on file.  Past Medical History:  Diagnosis Date  . Hypertension     Current Outpatient Medications  Medication Sig Dispense Refill  . aspirin 81 MG chewable tablet Chew 1 tablet (81 mg total) by mouth daily. 30 tablet 0  . carvedilol (COREG) 12.5 MG tablet Take 1 tablet (12.5 mg total) by mouth 2 (two) times daily with a meal. 60 tablet 0  . dapagliflozin propanediol (FARXIGA) 10 MG TABS tablet Take 1 tablet (10 mg total) by mouth daily. 30 tablet 0  . furosemide (LASIX) 40 MG tablet Take 1 tablet (40 mg total) by mouth daily. 30 tablet 0  . potassium chloride SA (KLOR-CON) 20  MEQ tablet Take 1 tablet (20 mEq total) by mouth daily. 30 tablet 0  . rosuvastatin (CRESTOR) 40 MG tablet Take 1 tablet (40 mg total) by mouth daily. 30 tablet 0  . sacubitril-valsartan (ENTRESTO) 49-51 MG Take 1 tablet by mouth 2 (two) times daily. 60 tablet 0  . spironolactone (ALDACTONE) 25 MG tablet Take 1 tablet (25 mg total) by mouth daily. 30 tablet 0   No current facility-administered medications for this encounter.    Vitals:   01/16/21 0906  BP: (!) 158/84  Pulse: 82  SpO2: 98%  Weight: (!) 140.7 kg (310 lb 2 oz)    PHYSICAL EXAM: Cardiac: JVD 10, normal rate and rhythm, clear s1 and s2, no murmurs, rubs or gallops, trace-1+ LE edema Pulmonary: decreased in bases L>R, not in distress Abdominal: non distended abdomen, soft and nontender Psych: Alert, conversant, in good spirits   ASSESSMENT & PLAN: Combined Systolic and Diastolic CHF: -ischemic cardiomyopathy, also likely a strong component of poorly controlled HTN, hx poor medication adherence -2018 EF of 30-35% -Echocardiogram 12/2020 EF 20 to 25%, grade 2 diastolic dysfunction, global hypokinesis -NYHA Class II, starting to retain some fluid again, weight up, REDS 39% -Currently taking Coreg 12.5 BID, Entresto 49/51, Aldactone 25mg , farxiga 10 -double lasix x 2 days and increase entresto to 97/101 -repeat bmp today -will need repeat echo in 3 months, I am hopeful EF will improve, determine need for ICD  CAD: -NSTEMI in 2017 with BMS to LAD, STEMI 2018 PTCA of LAD -no s/s of angina -continue asa, statin and chf meds above  HTN: -Remains hypertensive, slightly higher but mostly consistent with hospitalization bp before d/c -increase entresto as above  Social work will speak with him today about housing  Follow up with me in 2 weeks then will establish with AHF

## 2021-01-16 ENCOUNTER — Other Ambulatory Visit: Payer: Self-pay

## 2021-01-16 ENCOUNTER — Ambulatory Visit (HOSPITAL_COMMUNITY)
Admit: 2021-01-16 | Discharge: 2021-01-16 | Disposition: A | Discharge status: Home / Self Care | Payer: BC Managed Care – PPO | Attending: Internal Medicine | Admitting: Internal Medicine

## 2021-01-16 ENCOUNTER — Encounter (HOSPITAL_COMMUNITY): Payer: Self-pay

## 2021-01-16 VITALS — BP 158/84 | HR 82 | Wt 310.1 lb

## 2021-01-16 DIAGNOSIS — I2510 Atherosclerotic heart disease of native coronary artery without angina pectoris: Secondary | ICD-10-CM | POA: Diagnosis not present

## 2021-01-16 DIAGNOSIS — I255 Ischemic cardiomyopathy: Secondary | ICD-10-CM | POA: Diagnosis not present

## 2021-01-16 DIAGNOSIS — Z955 Presence of coronary angioplasty implant and graft: Secondary | ICD-10-CM | POA: Diagnosis not present

## 2021-01-16 DIAGNOSIS — I110 Hypertensive heart disease with heart failure: Secondary | ICD-10-CM | POA: Insufficient documentation

## 2021-01-16 DIAGNOSIS — E785 Hyperlipidemia, unspecified: Secondary | ICD-10-CM | POA: Diagnosis not present

## 2021-01-16 DIAGNOSIS — I5022 Chronic systolic (congestive) heart failure: Secondary | ICD-10-CM | POA: Diagnosis present

## 2021-01-16 DIAGNOSIS — Z9119 Patient's noncompliance with other medical treatment and regimen: Secondary | ICD-10-CM | POA: Insufficient documentation

## 2021-01-16 DIAGNOSIS — Z7982 Long term (current) use of aspirin: Secondary | ICD-10-CM | POA: Insufficient documentation

## 2021-01-16 DIAGNOSIS — I10 Essential (primary) hypertension: Secondary | ICD-10-CM | POA: Diagnosis not present

## 2021-01-16 DIAGNOSIS — Z79899 Other long term (current) drug therapy: Secondary | ICD-10-CM | POA: Diagnosis not present

## 2021-01-16 DIAGNOSIS — I252 Old myocardial infarction: Secondary | ICD-10-CM | POA: Insufficient documentation

## 2021-01-16 DIAGNOSIS — I5040 Unspecified combined systolic (congestive) and diastolic (congestive) heart failure: Secondary | ICD-10-CM | POA: Diagnosis not present

## 2021-01-16 LAB — BASIC METABOLIC PANEL
Anion gap: 7 (ref 5–15)
BUN: 16 mg/dL (ref 6–20)
CO2: 26 mmol/L (ref 22–32)
Calcium: 8.8 mg/dL — ABNORMAL LOW (ref 8.9–10.3)
Chloride: 102 mmol/L (ref 98–111)
Creatinine, Ser: 1.28 mg/dL — ABNORMAL HIGH (ref 0.61–1.24)
GFR, Estimated: >60 mL/min (ref >60)
Glucose, Bld: 101 mg/dL — ABNORMAL HIGH (ref 70–99)
Potassium: 4.1 mmol/L (ref 3.5–5.1)
Sodium: 135 mmol/L (ref 135–145)

## 2021-01-16 MED ORDER — POTASSIUM CHLORIDE CRYS ER 20 MEQ PO TBCR: 20.0000 meq | EXTENDED_RELEASE_TABLET | Freq: Every day | ORAL | 3 refills | Status: DC

## 2021-01-16 MED ORDER — CARVEDILOL 12.5 MG PO TABS: 12.5000 mg | ORAL_TABLET | Freq: Two times a day (BID) | ORAL | 3 refills | Status: DC

## 2021-01-16 MED ORDER — FUROSEMIDE 40 MG PO TABS: 40.0000 mg | ORAL_TABLET | Freq: Every day | ORAL | 3 refills | Status: DC

## 2021-01-16 MED ORDER — DAPAGLIFLOZIN PROPANEDIOL 10 MG PO TABS: 10.0000 mg | ORAL_TABLET | Freq: Every day | ORAL | 6 refills | Status: DC

## 2021-01-16 MED ORDER — SPIRONOLACTONE 25 MG PO TABS: 25.0000 mg | ORAL_TABLET | Freq: Every day | ORAL | 3 refills | Status: DC

## 2021-01-16 MED ORDER — ENTRESTO 97-103 MG PO TABS: 1.0000 | ORAL_TABLET | Freq: Two times a day (BID) | ORAL | 6 refills | Status: DC

## 2021-01-16 MED ORDER — ROSUVASTATIN CALCIUM 40 MG PO TABS: 40.0000 mg | ORAL_TABLET | Freq: Every day | ORAL | 6 refills | Status: DC

## 2021-01-16 NOTE — Patient Instructions (Signed)
Increase Entresto to 97/103 mg Twice daily   Increase Furosemide to 40 mg Twice daily FOR 2 DAYS ONLY, then back to 40 mg Daily  Labs done today, we will call you for abnormal results  Thank you for allowing [place] to provider your heart failure care after your recent hospitalization. Please follow-up with [place] again in 2 weeks, then with the Advanced Heart Failure Clinic in 1 month  If you have any questions, issues, or concerns before your next appointment please call our office at [phone], opt. 2 and leave a message for the triage nurse.  Do the following things EVERYDAY: 1) Weigh yourself in the morning before breakfast. Write it down and keep it in a log. 2) Take your medicines as prescribed 3) Eat low salt foods--Limit salt (sodium) to 2000 mg per day.  4) Stay as active as you can everyday 5) Limit all fluids for the day to less than 2 liters  Weigh yourself EVERY morning after you go to the bathroom but before you eat or drink anything. Write this number down in a weight log/diary. If you gain 3 pounds overnight or 5 pounds in a week, call the heart failure clinic  Limit your fluid intake to less than 2 liters of fluid per day. Fluid includes all drinks, coffee, juice, ice chips, soup, jello, and all other liquids.  Restrict your sodium intake to less than 2000mg  per day. This will help prevent your body from holding onto fluid. Read food labels as a lot of canned and packaged foods have a lot of sodium.

## 2021-01-16 NOTE — Progress Notes (Signed)
ReDS Vest / Clip - 01/16/21 0900      ReDS Vest / Clip   Station Marker D    Ruler Value 36    ReDS Value Range Moderate volume overload    ReDS Actual Value 39

## 2021-01-16 NOTE — Progress Notes (Signed)
CSW met with pt to check in regarding housing resources I had emailed last week.  Pt reports he did not receive- CSW resent resources to pt- he reports no other needs at this time.  Will continue to follow and assist as needed  Jorge Ny, [place] [facility] Desk#: [phone] Cell#: [phone]

## 2021-01-29 ENCOUNTER — Telehealth (HOSPITAL_COMMUNITY): Payer: Self-pay | Admitting: Licensed Clinical Social Worker

## 2021-01-29 NOTE — Telephone Encounter (Signed)
CSW contacted patient to remind of TOC appointment tomorrow. Patient confirmed. Jackie Silviano Neuser, LCSW, CCSW-MCS [phone] 

## 2021-01-30 ENCOUNTER — Other Ambulatory Visit (HOSPITAL_COMMUNITY): Payer: Self-pay

## 2021-01-30 ENCOUNTER — Other Ambulatory Visit: Payer: Self-pay

## 2021-01-30 ENCOUNTER — Ambulatory Visit (HOSPITAL_COMMUNITY)
Admission: RE | Admit: 2021-01-30 | Discharge: 2021-01-30 | Disposition: A | Discharge status: Home / Self Care | Payer: BC Managed Care – PPO | Source: Ambulatory Visit | Attending: Internal Medicine | Admitting: Internal Medicine

## 2021-01-30 VITALS — BP 162/100 | HR 86 | Wt 311.1 lb

## 2021-01-30 DIAGNOSIS — Z9119 Patient's noncompliance with other medical treatment and regimen: Secondary | ICD-10-CM | POA: Insufficient documentation

## 2021-01-30 DIAGNOSIS — I255 Ischemic cardiomyopathy: Secondary | ICD-10-CM | POA: Diagnosis not present

## 2021-01-30 DIAGNOSIS — E785 Hyperlipidemia, unspecified: Secondary | ICD-10-CM | POA: Insufficient documentation

## 2021-01-30 DIAGNOSIS — Z955 Presence of coronary angioplasty implant and graft: Secondary | ICD-10-CM | POA: Insufficient documentation

## 2021-01-30 DIAGNOSIS — Z79899 Other long term (current) drug therapy: Secondary | ICD-10-CM | POA: Insufficient documentation

## 2021-01-30 DIAGNOSIS — I252 Old myocardial infarction: Secondary | ICD-10-CM | POA: Insufficient documentation

## 2021-01-30 DIAGNOSIS — Z7982 Long term (current) use of aspirin: Secondary | ICD-10-CM | POA: Diagnosis not present

## 2021-01-30 DIAGNOSIS — I5042 Chronic combined systolic (congestive) and diastolic (congestive) heart failure: Secondary | ICD-10-CM | POA: Diagnosis not present

## 2021-01-30 DIAGNOSIS — I110 Hypertensive heart disease with heart failure: Secondary | ICD-10-CM | POA: Diagnosis not present

## 2021-01-30 DIAGNOSIS — I10 Essential (primary) hypertension: Secondary | ICD-10-CM

## 2021-01-30 DIAGNOSIS — I2510 Atherosclerotic heart disease of native coronary artery without angina pectoris: Secondary | ICD-10-CM | POA: Diagnosis not present

## 2021-01-30 DIAGNOSIS — I5022 Chronic systolic (congestive) heart failure: Secondary | ICD-10-CM | POA: Diagnosis not present

## 2021-01-30 LAB — BASIC METABOLIC PANEL
Anion gap: 10 (ref 5–15)
BUN: 13 mg/dL (ref 6–20)
CO2: 23 mmol/L (ref 22–32)
Calcium: 8.8 mg/dL — ABNORMAL LOW (ref 8.9–10.3)
Chloride: 102 mmol/L (ref 98–111)
Creatinine, Ser: 1.04 mg/dL (ref 0.61–1.24)
GFR, Estimated: >60 mL/min (ref >60)
Glucose, Bld: 99 mg/dL (ref 70–99)
Potassium: 3.8 mmol/L (ref 3.5–5.1)
Sodium: 135 mmol/L (ref 135–145)

## 2021-01-30 MED ORDER — HYDRALAZINE HCL 25 MG PO TABS: 25.0000 mg | ORAL_TABLET | Freq: Three times a day (TID) | ORAL | 1 refills | Status: DC

## 2021-01-30 MED ORDER — ISOSORBIDE MONONITRATE ER 30 MG PO TB24: 30.0000 mg | ORAL_TABLET | Freq: Every day | ORAL | 1 refills | Status: DC

## 2021-01-30 NOTE — Patient Instructions (Signed)
Start Imdur 30 mg Daily  Start Hydralazine 25 mg Three times a day   Increase Furosemide to 40 mg Twice daily FOR 2 DAYS ONLY then back to 40 mg Daily  Labs done today, we will call you for abnormal results  Thank you for allowing [place] to provider your heart failure care after your recent hospitalization. Please follow-up with our Advanced Heart Failure Clinic as scheduled  If you have any questions, issues, or concerns before your next appointment please call our office at [phone], opt. 2 and leave a message for the triage nurse.

## 2021-01-30 NOTE — Progress Notes (Signed)
ReDS Vest / Clip - 01/30/21 1000       ReDS Vest / Clip   Station Marker D    Ruler Value 38    ReDS Value Range Moderate volume overload    ReDS Actual Value 38

## 2021-01-30 NOTE — Progress Notes (Signed)
Heart and Vascular Care Navigation  01/30/2021  Scott Dunlap 11-02-1970 [id number]  Reason for Referral: Patient seen in HF TOC.   Engaged with patient face to face for follow up visit for Heart and Vascular Care Coordination.                                                                                                   Assessment: Patient is a 50yo single male who currently resides with his mother.  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 He was recently hospitalized and missed work. He states he is trying to save up to move out of his mothers home but now behind in bills due to missed work. Patient was provided a housing list and will begin to search for housing options as he states "my mother and I don't really get along". He states he his insurance covers his medications and has two co pay cards  for [place]. Patient reports he is now back to work and hopeful for improved health. Patient requested assistance  with PCP and specifically asked for a [employer] site. CSW contacted multiple sites with patient present and he was scheduled for [employer] on 03-13-21 @ 10am.                           HRT/VAS Care Coordination     Patients Home Cardiology Office --  [facility]   Outpatient Care Team Social Worker   Social Worker Name: Lasandra Beech, [name] [phone]   Living arrangements for the past 2 months Single Family Home   Lives with:  Mother   Patient Current Optometrist   Patient Has Concern With Paying Medical Bills No   Does Patient Have Prescription Coverage? Yes   Home Assistive Devices/Equipment None   DME Agency NA       Social History:                                                                             SDOH Screenings   Alcohol Screen: Medium Risk   Last Alcohol Screening Score (AUDIT): 10  Depression (PHQ2-9): Not on file  Financial Resource Strain: Medium Risk   Difficulty of  Paying Living Expenses: Somewhat hard  Food Insecurity: No Food Insecurity   Worried About Running Out of Food in the Last Year: Never true   Ran Out of Food in the Last Year: Never true  Housing: Low Risk    Last Housing Risk Score: 0  Physical Activity: Not on file  Social Connections: Socially Isolated   Frequency of Communication with Friends and Family: Twice a week   Frequency of Social Gatherings with Friends and Family: Never   Attends Religious Services: Never  Active Member of Clubs or Organizations: No   Attends Banker Meetings: Never   Marital Status: Separated  Stress: Not on file  Tobacco Use: Low Risk    Smoking Tobacco Use: Never   Smokeless Tobacco Use: Never  Transportation Needs: No Transportation Needs   Lack of Transportation (Medical): No   Lack of Transportation (Non-Medical): No    SDOH Interventions: Financial Resources:  Financial Strain Interventions: Other (Comment) (Patient Care Fund) Will explore assistance for housing deposit through the Patient Care Fund .  Food Insecurity:   N/a  Housing Insecurity:  Housing Interventions: Other (Comment) (Patient in temporary housing.)  Transportation:    N/a    Other Care Navigation Interventions:     Inpatient/Outpatient Substance Abuse Counseling/Rehab Options N/a  Provided Pharmacy assistance resources  Patient provided co pay cards for Marcelline Deist and Entresto  Patient expressed Mental Health concerns No.  Patient Referred to:    Follow-up plan:  Patient will begin search for housing opportunities and return call to CSW if further assistance for deposit is needed. Patient has PCP appointment as requested and denies any other concerns at this time. CSW available as needed. Lasandra Beech, LCSW, CCSW-MCS [phone]

## 2021-01-30 NOTE — Progress Notes (Signed)
[facility] Transitions of Care Clinic  PCP: Gwinda Passe Primary Cardiologist: Chilton Si  HPI:  Scott Dunlap is a 50 y.o.  male  with a PMH significant for coronary artery disease, ischemic cardiomyopathy, hypertension and hyperlipidemia, poor medication adherence  NSTEMI in 2017 with BMS to LAD, STEMI 2018 PTCA of LAD.  He has a history of noncompliance.  He had an echocardiogram in 2018 which showed an EF of 30-35%. He didn't follow up to have this repeated when ordered last year.   [place] [facility], BP 180/120, only taking one of his meds, hctz at that time .  BP meds restarted with 2 week fu arranged but pt never followed up.   01/04/21 patient presented to [facility] ED, blood pressure 201/142, chest x-ray revealed pulmonary vascular congestion.  BNP was elevated at 707.  Patient was admitted for new onset congestive heart failure, hypertensive urgency.  He was given IV Lasix.  Echocardiogram revealed EF 20 to 25%, grade 2 diastolic dysfunction, global hypokinesis.  Cardiology consulted.  Continued on IV Lasix, started on GDMT Coreg, Entresto, Aldactone, farxiga. Diuresed 308lbs > 297lbs.  Discharge Cr 1.4 from 1.17 on admission, K 4.6.    Last visit was hospital Fu appointment. He was doing better, improvement in breathing and exercise capacity.  On examination he was starting to retain some fluid again, weight up to 310lbs, REDS 39% so we doubled his lasix for 2 days and increased Entresto to full dose.    Weight has been stable at 309-310 by his home scales.  Still feeling well, breathing well denies shortness of breath with exertion.  Can walk for about a mile to a mile and a half [place].  Denies any chest pain, orthopnea or PND.  He forgot to double his lasix for two days as we discussed last visit.     ROS: All systems negative except as listed in HPI, PMH and Problem List.  SH:   Social History   Socioeconomic History   Marital status: Legally Separated    Spouse name: Not on file   Number of children: 1   Years of education: Not on file   Highest education level: Some college, no degree  Occupational History   Occupation: clamp truck Designer, television/film set  Tobacco Use   Smoking status: Never   Smokeless tobacco: Never  Vaping Use   Vaping Use: Never used  Substance and Sexual Activity   Alcohol use: Yes    Alcohol/week: 25.0 standard drinks    Types: 25 Shots of liquor per week    Comment: 5 shots every other day of vodka   Drug use: Yes    Frequency: 14.0 times per week    Types: Marijuana   Sexual activity: Not on file  Other Topics Concern   Not on file  Social History Narrative   Not on file   Social Determinants of Health   Financial Resource Strain: Low Risk    Difficulty of Paying Living Expenses: Not very hard  Food Insecurity: No Food Insecurity   Worried About Running Out of Food in the Last Year: Never true   Ran Out of Food in the Last Year: Never true  Transportation Needs: No Transportation Needs   Lack of Transportation (Medical): No   Lack of Transportation (Non-Medical): No  Physical Activity: Not on file  Stress: Not on file  Social Connections: Socially Isolated  Frequency of Communication with Friends and Family: Twice a week   Frequency of Social Gatherings with Friends and Family: Never   Attends Religious Services: Never   Database administrator or Organizations: No   Attends Banker Meetings: Never   Marital Status: Separated  Intimate Partner Violence: Not on file    FH: No family history on file.  Past Medical History:  Diagnosis Date   Hypertension     Current Outpatient Medications  Medication Sig Dispense Refill   aspirin 81 MG chewable tablet Chew 1 tablet (81 mg total) by mouth daily. 30 tablet 0   carvedilol (COREG) 12.5 MG tablet Take 1 tablet (12.5 mg total) by mouth 2 (two) times daily with a  meal. 60 tablet 3   dapagliflozin propanediol (FARXIGA) 10 MG TABS tablet Take 1 tablet (10 mg total) by mouth daily. 30 tablet 6   furosemide (LASIX) 40 MG tablet Take 1 tablet (40 mg total) by mouth daily. 30 tablet 3   rosuvastatin (CRESTOR) 40 MG tablet Take 1 tablet (40 mg total) by mouth daily. 30 tablet 6   sacubitril-valsartan (ENTRESTO) 97-103 MG Take 1 tablet by mouth 2 (two) times daily. 60 tablet 6   spironolactone (ALDACTONE) 25 MG tablet Take 1 tablet (25 mg total) by mouth daily. 30 tablet 3   No current facility-administered medications for this encounter.    Vitals:   01/30/21 0910  BP: (!) 162/100  Pulse: 86  SpO2: 97%  Weight: (!) 141.1 kg (311 lb 2 oz)     PHYSICAL EXAM: Cardiac: JVD 8-9, normal rate and rhythm, clear s1 and s2, no murmurs, rubs or gallops, trace LE edema Pulmonary: CTAB, not in distress Abdominal: non distended abdomen, soft and nontender Psych: Alert, conversant, in good spirits   ASSESSMENT & PLAN: Combined Systolic and Diastolic CHF: -ischemic cardiomyopathy, also likely a strong component of poorly controlled HTN, hx poor medication adherence -2018 EF of 30-35% -Echocardiogram 12/2020 EF 20 to 25%, grade 2 diastolic dysfunction, global hypokinesis -NYHA Class II, mildly volume up on exam, REDS 38% -Currently taking Coreg 12.5 BID, Entresto 97/101, Aldactone 25mg , farxiga 10, lasix 40mg  daily -increase lasix to twice daily for two days then back to 40 daily -start imdur 30mg  and hydralazine 25mg  TID -repeat bmp today -will need repeat echo in 3 months, I am hopeful EF will improve, determine need for ICD  CAD: -NSTEMI in 2017 with BMS to LAD, STEMI 2018 PTCA of LAD -no s/s of angina -continue asa, statin and chf meds above  HTN: -Remains hypertensive despite increased entresto dose last visit -start imdur/hydral as above    Will establish with [facility] for social support, medication assistance

## 2021-02-09 ENCOUNTER — Inpatient Hospital Stay (INDEPENDENT_AMBULATORY_CARE_PROVIDER_SITE_OTHER): Payer: BC Managed Care – PPO | Admitting: Primary Care

## 2021-02-20 ENCOUNTER — Encounter (HOSPITAL_COMMUNITY): Payer: BC Managed Care – PPO

## 2021-03-02 ENCOUNTER — Telehealth (HOSPITAL_COMMUNITY): Payer: Self-pay | Admitting: Licensed Clinical Social Worker

## 2021-03-02 NOTE — Telephone Encounter (Signed)
Patient called and requested information on housing options. CSW will mail social serve list for patient to explore. Patient informed to return call to CSW if needed. Lasandra Beech, LCSW, CCSW-MCS [phone]

## 2021-03-05 ENCOUNTER — Telehealth (HOSPITAL_COMMUNITY): Payer: Self-pay | Admitting: Licensed Clinical Social Worker

## 2021-03-05 NOTE — Telephone Encounter (Signed)
Patient called over the weekend to share that he received the list of housing options and would like to explore the [facility] apartments. CSW encouraged patient to contact office and make application for an apartment. Patient verbalizes understanding and will contact CSW if available options. Lasandra Beech, LCSW, CCSW-MCS [phone]

## 2021-03-12 ENCOUNTER — Encounter (HOSPITAL_COMMUNITY): Payer: BC Managed Care – PPO

## 2021-03-13 ENCOUNTER — Ambulatory Visit: Payer: BC Managed Care – PPO | Admitting: Family Medicine

## 2021-04-16 ENCOUNTER — Other Ambulatory Visit (HOSPITAL_COMMUNITY): Payer: Self-pay

## 2021-07-30 NOTE — Progress Notes (Signed)
ADVANCED HF CLINIC CONSULT NOTE  Referring Physician: Dr. Vickki Muff Primary Care: Haydee Salter, MD HF Cardiologist: Dr. Aundra Dubin  HPI: Scott Dunlap is a 50 y.o. male  with a PMH significant for coronary artery disease, ischemic cardiomyopathy, hypertension and hyperlipidemia, poor medication adherence.   NSTEMI in 2017 with BMS to LAD, STEMI 2018 PTCA of LAD.  He has a history of noncompliance.   Echocardiogram in 2018 which showed an EF of 30-35%. He didn't follow up to have this repeated when ordered last year.    [place] [facility], BP 180/120, only taking one of his meds, hctz at that time .  BP meds restarted with 2 week fu arranged but pt never followed up.    01/04/21 patient presented to [facility] ED, blood pressure 201/142, chest x-ray revealed pulmonary vascular congestion.  BNP was elevated at 707.  Patient was admitted for new onset congestive heart failure, hypertensive urgency.  He was given IV Lasix.  Echo revealed EF 20 to 123456, grade 2 diastolic dysfunction, global hypokinesis.  Cardiology consulted.  Continued on IV Lasix, started on GDMT Coreg, Entresto, Aldactone, Farxiga. Diuresed 308lbs > 297lbs.  Discharge Cr 1.4 from 1.17 on admission, K 4.6.     Seen in [facility] clinic 6/22 and GDMT continued to be titrated. Unfortunately did not follow up with HF as scheduled.   Today he presents to establish with HF follow up. He is SOB at work walking on flat ground. He feeling palpitations and has some atypical chest pain at night. Denies abnormal bleeding, CP, dizziness, edema, or PND/Orthopnea. Appetite ok. No fever or chills. Does not weigh at home. 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 Stopped smoking THC 6 months ago, drinking 5 shots ETOH to sleep. Snores and is tired during the day. BP 170/140s at home. He has not been taking lasix, carvedilol, crestor or spiro regularly, only takes when he does not have to work. Eats out  every day during the week. Likes [employer] or [employer].   Review of Systems: [y] = yes, [ ]  = no   General: Weight gain [ ] ; Weight loss [ ] ; Anorexia [ ] ; Fatigue Blue.Reese ]; Fever [ ] ; Chills [ ] ; Weakness Blue.Reese ]  Cardiac: Chest pain/pressure [ ] ; Resting SOB [ ] ; Exertional SOB [ ] ; Orthopnea [ ] ; Pedal Edema [ ] ; Palpitations Blue.Reese ]; Syncope [ ] ; Presyncope [ ] ; Paroxysmal nocturnal dyspnea[ ]   Pulmonary: Cough [ ] ; Wheezing[ ] ; Hemoptysis[ ] ; Sputum [ ] ; Snoring [y]  GI: Vomiting[ ] ; Dysphagia[ ] ; Melena[ ] ; Hematochezia [ ] ; Heartburn[ ] ; Abdominal pain [ ] ; Constipation [ ] ; Diarrhea [ ] ; BRBPR [ ]   GU: Hematuria[ ] ; Dysuria [ ] ; Nocturia[ ]   Vascular: Pain in legs with walking [ ] ; Pain in feet with lying flat [ ] ; Non-healing sores [ ] ; Stroke [ ] ; TIA [ ] ; Slurred speech [ ] ;  Neuro: Headaches[ ] ; Vertigo[ ] ; Seizures[ ] ; Paresthesias[ ] ;Blurred vision [ ] ; Diplopia [ ] ; Vision changes [ ]   Ortho/Skin: Arthritis [ ] ; Joint pain [ ] ; Muscle pain [ ] ; Joint swelling [ ] ; Back Pain [ ] ; Rash [ ]   Psych: Depression[ ] ; Anxiety[ ]   Heme: Bleeding problems [ ] ; Clotting disorders [ ] ; Anemia [ ]   Endocrine: Diabetes [ ] ; Thyroid dysfunction[ ]   Past Medical History:  Diagnosis Date   Hypertension    Current Outpatient Medications  Medication Sig Dispense Refill   aspirin  81 MG chewable tablet Chew 1 tablet (81 mg total) by mouth daily. 30 tablet 0   carvedilol (COREG) 12.5 MG tablet Take 1 tablet (12.5 mg total) by mouth 2 (two) times daily with a meal. 60 tablet 3   dapagliflozin propanediol (FARXIGA) 10 MG TABS tablet Take 1 tablet (10 mg total) by mouth daily. 30 tablet 6   furosemide (LASIX) 40 MG tablet Take 1 tablet (40 mg total) by mouth daily. 30 tablet 3   hydrALAZINE (APRESOLINE) 25 MG tablet Take 1 tablet (25 mg total) by mouth 3 (three) times daily. (Patient not taking: Reported on 07/31/2021) 90 tablet 1   isosorbide mononitrate (IMDUR) 30 MG 24 hr tablet Take 1 tablet (30 mg total) by  mouth daily. (Patient not taking: Reported on 07/31/2021) 30 tablet 1   rosuvastatin (CRESTOR) 40 MG tablet Take 1 tablet (40 mg total) by mouth daily. 30 tablet 6   sacubitril-valsartan (ENTRESTO) 97-103 MG Take 1 tablet by mouth 2 (two) times daily. 60 tablet 6   spironolactone (ALDACTONE) 25 MG tablet Take 1 tablet (25 mg total) by mouth daily. 30 tablet 3   No current facility-administered medications for this encounter.   No Known Allergies  Social History   Socioeconomic History   Marital status: Legally Separated    Spouse name: Not on file   Number of children: 1   Years of education: Not on file   Highest education level: Some college, no degree  Occupational History   Occupation: clamp truck Designer, television/film set  Tobacco Use   Smoking status: Never   Smokeless tobacco: Never  Vaping Use   Vaping Use: Never used  Substance and Sexual Activity   Alcohol use: Yes    Alcohol/week: 25.0 standard drinks    Types: 25 Shots of liquor per week    Comment: 5 shots every other day of vodka   Drug use: Yes    Frequency: 14.0 times per week    Types: Marijuana   Sexual activity: Not on file  Other Topics Concern   Not on file  Social History Narrative   Not on file   Social Determinants of Health   Financial Resource Strain: Medium Risk   Difficulty of Paying Living Expenses: Somewhat hard  Food Insecurity: No Food Insecurity   Worried About Programme researcher, broadcasting/film/video in the Last Year: Never true   Ran Out of Food in the Last Year: Never true  Transportation Needs: No Transportation Needs   Lack of Transportation (Medical): No   Lack of Transportation (Non-Medical): No  Physical Activity: Not on file  Stress: Not on file  Social Connections: Socially Isolated   Frequency of Communication with Friends and Family: Twice a week   Frequency of Social Gatherings with Friends and Family: Never   Attends Religious Services: Never   Database administrator or Organizations: No   Attends Occupational hygienist Meetings: Never   Marital Status: Separated  Intimate Partner Violence: Not on file   No family history on file.  BP (!) 134/92   Pulse 78   Wt (!) 142.4 kg (314 lb)   SpO2 98%   BMI 41.43 kg/m   Wt Readings from Last 3 Encounters:  07/31/21 (!) 142.4 kg (314 lb)  01/30/21 (!) 141.1 kg (311 lb 2 oz)  01/16/21 (!) 140.7 kg (310 lb 2 oz)   PHYSICAL EXAM: General:  NAD. No resp difficulty HEENT: Normal Neck: Supple. Thick neck. Carotids 2+ bilat; no bruits. No  lymphadenopathy or thryomegaly appreciated. Cor: PMI laterally displaced. Regular rate & rhythm. No rubs, gallops or murmurs. Lungs: Clear Abdomen: Obese, nontender, nondistended. No hepatosplenomegaly. No bruits or masses. Good bowel sounds. Extremities: No cyanosis, clubbing, rash, edema Neuro: Alert & oriented x 3, cranial nerves grossly intact. Moves all 4 extremities w/o difficulty. Affect pleasant.  ECG: SR, LVH (personally reviewed)  ReDs: 49%  ASSESSMENT & PLAN:  1. Combined Systolic and Diastolic CHF/ICM: Likely a strong component of poorly controlled HTN, hx poor medication adherence. EF in 2018 30-35%. - Echo (5/22): EF 20 to 123456, grade 2 diastolic dysfunction, global hypokinesis - NYHA Class IIIb, mildly volume up on exam, ReDS 49%  - Restart Coreg 12.5 bid. - Continue Entresto 97/103 mg bid.  - Restart spiro 25 mg. BMET/BNP today, repeat BMET in 7-10 days - Restart Farxiga 10 mg daily. - Restart Lasix 40 mg daily. May need bid dosing. - Plan to repeat echo in 3 months after meds optimized.   2. CAD: - NSTEMI in 2017 with BMS to LAD, STEMI 2018 PTCA of LAD - No s/s of angina. - Continue asa, restart statin + meds above. - Check Lipids 6-8 weeks  3. HTN: - Uncontrolled. Restart meds as above. - Consider renal artery doppler if unable to bring down BP. - Sleep study.   4. ETOH use - Discussed cessation. - Needs follow up with PCP to discuss sleep aide  5. Snoring - Arrange home  sleep study.  6. SDOH - He may ultimately require paramedicine support, will address at next visit. - Needs PCP to discuss insomnia.  Follow up with APP in 3 weeks (may need Imdur + hydralazine) and Dr. Aundra Dubin in 12 weeks with echo.   Allena Katz, FNP-BC 07/31/21

## 2021-07-31 ENCOUNTER — Other Ambulatory Visit: Payer: Self-pay

## 2021-07-31 ENCOUNTER — Ambulatory Visit (HOSPITAL_COMMUNITY)
Admission: RE | Admit: 2021-07-31 | Discharge: 2021-07-31 | Disposition: A | Discharge status: Home / Self Care | Payer: BC Managed Care – PPO | Source: Ambulatory Visit | Attending: Family Medicine | Admitting: Family Medicine

## 2021-07-31 ENCOUNTER — Encounter (HOSPITAL_COMMUNITY): Payer: Self-pay

## 2021-07-31 VITALS — BP 134/92 | HR 78 | Wt 314.0 lb

## 2021-07-31 DIAGNOSIS — Z87891 Personal history of nicotine dependence: Secondary | ICD-10-CM | POA: Diagnosis not present

## 2021-07-31 DIAGNOSIS — R0683 Snoring: Secondary | ICD-10-CM

## 2021-07-31 DIAGNOSIS — R0602 Shortness of breath: Secondary | ICD-10-CM | POA: Diagnosis present

## 2021-07-31 DIAGNOSIS — F1010 Alcohol abuse, uncomplicated: Secondary | ICD-10-CM | POA: Diagnosis not present

## 2021-07-31 DIAGNOSIS — Z79899 Other long term (current) drug therapy: Secondary | ICD-10-CM | POA: Diagnosis not present

## 2021-07-31 DIAGNOSIS — Z7982 Long term (current) use of aspirin: Secondary | ICD-10-CM | POA: Diagnosis not present

## 2021-07-31 DIAGNOSIS — I255 Ischemic cardiomyopathy: Secondary | ICD-10-CM | POA: Insufficient documentation

## 2021-07-31 DIAGNOSIS — E785 Hyperlipidemia, unspecified: Secondary | ICD-10-CM | POA: Insufficient documentation

## 2021-07-31 DIAGNOSIS — I5040 Unspecified combined systolic (congestive) and diastolic (congestive) heart failure: Secondary | ICD-10-CM | POA: Diagnosis not present

## 2021-07-31 DIAGNOSIS — R0789 Other chest pain: Secondary | ICD-10-CM | POA: Insufficient documentation

## 2021-07-31 DIAGNOSIS — I10 Essential (primary) hypertension: Secondary | ICD-10-CM

## 2021-07-31 DIAGNOSIS — I5022 Chronic systolic (congestive) heart failure: Secondary | ICD-10-CM

## 2021-07-31 DIAGNOSIS — I252 Old myocardial infarction: Secondary | ICD-10-CM | POA: Diagnosis not present

## 2021-07-31 DIAGNOSIS — R002 Palpitations: Secondary | ICD-10-CM | POA: Insufficient documentation

## 2021-07-31 DIAGNOSIS — Z7984 Long term (current) use of oral hypoglycemic drugs: Secondary | ICD-10-CM | POA: Insufficient documentation

## 2021-07-31 DIAGNOSIS — Z139 Encounter for screening, unspecified: Secondary | ICD-10-CM

## 2021-07-31 DIAGNOSIS — I110 Hypertensive heart disease with heart failure: Secondary | ICD-10-CM | POA: Diagnosis not present

## 2021-07-31 DIAGNOSIS — Z09 Encounter for follow-up examination after completed treatment for conditions other than malignant neoplasm: Secondary | ICD-10-CM | POA: Diagnosis not present

## 2021-07-31 DIAGNOSIS — I2510 Atherosclerotic heart disease of native coronary artery without angina pectoris: Secondary | ICD-10-CM

## 2021-07-31 LAB — BASIC METABOLIC PANEL
Anion gap: 8 (ref 5–15)
BUN: 13 mg/dL (ref 6–20)
CO2: 28 mmol/L (ref 22–32)
Calcium: 9 mg/dL (ref 8.9–10.3)
Chloride: 102 mmol/L (ref 98–111)
Creatinine, Ser: 1.36 mg/dL — ABNORMAL HIGH (ref 0.61–1.24)
GFR, Estimated: >60 mL/min (ref >60)
Glucose, Bld: 103 mg/dL — ABNORMAL HIGH (ref 70–99)
Potassium: 4.1 mmol/L (ref 3.5–5.1)
Sodium: 138 mmol/L (ref 135–145)

## 2021-07-31 LAB — BRAIN NATRIURETIC PEPTIDE: B Natriuretic Peptide: 408.5 pg/mL — ABNORMAL HIGH (ref 0.0–100.0)

## 2021-07-31 MED ORDER — CARVEDILOL 12.5 MG PO TABS: 12.5000 mg | ORAL_TABLET | Freq: Two times a day (BID) | ORAL | 3 refills | Status: DC

## 2021-07-31 MED ORDER — ROSUVASTATIN CALCIUM 40 MG PO TABS: 40.0000 mg | ORAL_TABLET | Freq: Every day | ORAL | 6 refills | Status: DC

## 2021-07-31 MED ORDER — SPIRONOLACTONE 25 MG PO TABS: 25.0000 mg | ORAL_TABLET | Freq: Every day | ORAL | 3 refills | Status: DC

## 2021-07-31 MED ORDER — ENTRESTO 97-103 MG PO TABS: 1.0000 | ORAL_TABLET | Freq: Two times a day (BID) | ORAL | 6 refills | Status: DC

## 2021-07-31 MED ORDER — FUROSEMIDE 40 MG PO TABS: 40.0000 mg | ORAL_TABLET | Freq: Every day | ORAL | 3 refills | Status: DC

## 2021-07-31 MED ORDER — DAPAGLIFLOZIN PROPANEDIOL 10 MG PO TABS: 10.0000 mg | ORAL_TABLET | Freq: Every day | ORAL | 6 refills | Status: DC

## 2021-07-31 NOTE — Progress Notes (Signed)
°   °  Referring Provider: Prince Rome NP  Today's Date: 07/31/2021  Date:   STOP BANG RISK ASSESSMENT S (snore) Have you been told that you snore?     YES   T (tired) Are you often tired, fatigued, or sleepy during the day?   YES  O (obstruction) Do you stop breathing, choke, or gasp during sleep? YES   P (pressure) Do you have or are you being treated for high blood pressure? YES   B (BMI) Is your body index greater than 35 kg/m? YES   A (age) Are you 68 years old or older? YES   N (neck) Do you have a neck circumference greater than 16 inches?   NO   G (gender) Are you a male? YES   TOTAL STOP/BANG YES ANSWERS 7                                                                       For Office Use Only              Procedure Order Form    YES to 3+ Stop Bang questions OR two clinical symptoms - patient qualifies for WatchPAT (CPT 95800)

## 2021-07-31 NOTE — Progress Notes (Signed)
ReDS Vest / Clip - 07/31/21 1000       ReDS Vest / Clip   Station Marker D    Ruler Value 36    ReDS Value Range High volume overload    ReDS Actual Value 49

## 2021-07-31 NOTE — Progress Notes (Signed)
Patient Name: Scott Dunlap        DOB: May 14, 1971      Height: 6\' 1"     Weight: 314 lb  Office Name: [facility]         Referring Provider: , NP/ McLean,MD  Today's Date: 07/31/2021   STOP BANG RISK ASSESSMENT S (snore) Have you been told that you snore?     YES   T (tired) Are you often tired, fatigued, or sleepy during the day?   NO  O (obstruction) Do you stop breathing, choke, or gasp during sleep? NO   P (pressure) Do you have or are you being treated for high blood pressure? YES   B (BMI) Is your body index greater than 35 kg/m? YES   A (age) Are you 51 years old or older? YES   N (neck) Do you have a neck circumference greater than 16 inches?   YES   G (gender) Are you a male? YES   TOTAL STOP/BANG YES ANSWERS                                                                        For Office Use Only              Procedure Order Form    YES to 3+ Stop Bang questions OR two clinical symptoms - patient qualifies for WatchPAT (CPT 95800)             Clinical Notes: Will consult Sleep Specialist and refer for management of therapy due to patient increased risk of Sleep Apnea. Ordering a sleep study due to the following two clinical symptoms:/ Loud snoring R06.83  History of high blood pressure R03.0   I understand that I am proceeding with a home sleep apnea test as ordered by my treating physician. I understand that untreated sleep apnea is a serious cardiovascular risk factor and it is my responsibility to perform the test and seek management for sleep apnea. I will be contacted with the results and be managed for sleep apnea by a local sleep physician. I will be receiving equipment and further instructions from [facility]. I shall promptly ship back the equipment via the included mailing label. I understand my insurance will be billed for the test and as the patient I am responsible for any insurance related out-of-pocket costs  incurred. I have been provided with written instructions and can call for additional video or telephonic instruction, with 24-hour availability of qualified personnel to answer any questions: Patient Help Desk [phone].  Patient Signature ______________________________________________________   Date______________________ Patient Telemedicine Verbal Consent

## 2021-07-31 NOTE — Progress Notes (Signed)
Patient given ordered home sleep study device in clinic to complete along with instructions verbally and written.  I have sent a notification to Ceasar Lund CMA to check if insurance pre-cert is necessary.  Patient is aware to await phone call to perform the study.

## 2021-07-31 NOTE — Patient Instructions (Signed)
Medication Changes:  Continue:   Carvedilol 12.5 twice daily Lasix 40 mg daily Crestor 40 mg daily Entresto 97/103 twice a day Spironolactone 25 mg daily Farxiga 10 mg daily Aspirin 81 mg daily     Lab Work:  Labs today We will only contact you if something comes back abnormal or we need to make some changes. Otherwise no news is good news!  Labs are needed in 7-10 days.  Testing/Procedures:  Your physician has recommended that you have a home sleep study. This test records several body functions during sleep, including: brain activity, eye movement, oxygen and carbon dioxide blood levels, heart rate and rhythm, breathing rate and rhythm, the flow of air through your mouth and nose, snoring, body muscle movements, and chest and belly movement.   Special Instructions // Education:  Do the following things EVERYDAY: Weigh yourself in the morning before breakfast. Write it down and keep it in a log. Take your medicines as prescribed Eat low salt foods--Limit salt (sodium) to 2000 mg per day.  Stay as active as you can everyday Limit all fluids for the day to less than 2 liters   Your physician has requested that you regularly monitor and record your blood pressure readings at home. Please use the same machine at the same time of day to check your readings and record them to bring to your follow-up visit.   Follow-Up in: 3 weeks   At the [facility], you and your health needs are our priority. We have a designated team specialized in the treatment of Heart Failure. This Care Team includes your primary Heart Failure Specialized Cardiologist (physician), Advanced Practice Providers (APPs- Physician Assistants and Nurse Practitioners), and Pharmacist who all work together to provide you with the care you need, when you need it.   You may see any of the following providers on your designated Care Team at your next follow up:  Dr Arvilla Meres Dr Carron Curie, NP Robbie Lis, [place] [facility] [place], [place] Karle Plumber, PharmD   Please be sure to bring in all your medications bottles to every appointment.   Need to Contact us:  If you have any questions or concerns before your next appointment please send [place] a message through [place] or call our office at [phone].    TO LEAVE A MESSAGE FOR THE NURSE SELECT OPTION 2, PLEASE LEAVE A MESSAGE INCLUDING: YOUR NAME DATE OF BIRTH CALL BACK NUMBER REASON FOR CALL**this is important as we prioritize the call backs  YOU WILL RECEIVE A CALL BACK THE SAME DAY AS LONG AS YOU CALL BEFORE 4:00 PM

## 2021-08-01 ENCOUNTER — Telehealth (HOSPITAL_COMMUNITY): Payer: Self-pay | Admitting: *Deleted

## 2021-08-01 NOTE — Telephone Encounter (Signed)
No pre cert reqd for home sleep study  

## 2021-08-03 ENCOUNTER — Telehealth (HOSPITAL_COMMUNITY): Payer: Self-pay | Admitting: Surgery

## 2021-08-03 NOTE — Telephone Encounter (Signed)
Patient called and informed to proceed with the ordered home sleep study.  Per Ceasar Lund CMA insurance precert not required.

## 2021-08-06 ENCOUNTER — Encounter (INDEPENDENT_AMBULATORY_CARE_PROVIDER_SITE_OTHER): Payer: BC Managed Care – PPO | Admitting: Cardiology

## 2021-08-06 DIAGNOSIS — G4733 Obstructive sleep apnea (adult) (pediatric): Secondary | ICD-10-CM | POA: Diagnosis not present

## 2021-08-07 ENCOUNTER — Other Ambulatory Visit (HOSPITAL_COMMUNITY): Payer: BC Managed Care – PPO

## 2021-08-07 NOTE — Procedures (Signed)
° °  Sleep Study Report  Patient Information Study Date: 08/06/21 Patient Name: Scott Dunlap Patient ID: [id number] Birth Date: 2071-03-09 Age: 50 Gender: Male Referring Physician:Jessica Milford, NP  TEST DESCRIPTION: Home sleep apnea testing was completed using the WatchPat, a Type 1 device, utilizing peripheral arterial tonometry (PAT), chest movement, actigraphy, pulse oximetry, pulse rate, body position and snore. AHI was calculated with apnea and hypopnea using valid sleep time as the denominator. RDI includes apneas, hypopneas, and RERAs. The data acquired and the scoring of sleep and all associated events were performed in accordance with the recommended standards and specifications as outlined in the AASM Manual for the Scoring of Sleep and Associated Events 2.2.0 (2015).  FINDINGS: 1. Severe Obstructive Sleep Apnea with AHI 31.3/hr. 2. No Central Sleep Apnea with pAHIc 3.1/hr. 3. Oxygen desaturations as low as 82%. 4. Mild snoring was present. O2 sats were < 88% for 0.6 min. 5. Total sleep time was 3 hrs and 31 min. 6. 27.4% of total sleep time was spent in REM sleep. 7. Shortened sleep onset latency at 8 min. 8. Normal REM sleep onset latency at 93 min. 9. Total awakenings were 14.  DIAGNOSIS: Severe Obstructive Sleep Apnea (G47.33)  RECOMMENDATIONS: 1. Clinical correlation of these findings is necessary. The decision to treat obstructive sleep apnea (OSA) is usually based on the presence of apnea symptoms or the presence of associated medical conditions such as Hypertension, Congestive Heart Failure, Atrial Fibrillation or Obesity. The most common symptoms of OSA are snoring, gasping for breath while sleeping, daytime sleepiness and fatigue.  2. Initiating apnea therapy is recommended given the presence of symptoms and/or associated conditions. Recommend proceeding with one of the following:   a. Auto-CPAP therapy with a pressure range of 5-20cm H2O.   b. An oral  appliance (OA) that can be obtained from certain dentists with expertise in sleep medicine. These are primarily of use in non-obese patients with mild and moderate disease.   c. An ENT consultation which may be useful to look for specific causes of obstruction and possible treatment options.   d. If patient is intolerant to PAP therapy, consider referral to ENT for evaluation for hypoglossal nerve stimulator.  3. Close follow-up is necessary to ensure success with CPAP or oral appliance therapy for maximum benefit .  4. A follow-up oximetry study on CPAP is recommended to assess the adequacy of therapy and determine the need for supplemental oxygen or the potential need for Bi-level therapy. An arterial blood gas to determine the adequacy of baseline ventilation and oxygenation should also be considered.  5. Healthy sleep recommendations include: adequate nightly sleep (normal 7-9 hrs/night), avoidance of caffeine after noon and alcohol near bedtime, and maintaining a sleep environment that is cool, dark and quiet.  6. Weight loss for overweight patients is recommended. Even modest amounts of weight loss can significantly improve the severity of sleep apnea.  7. Snoring recommendations include: weight loss where appropriate, side sleeping, and avoidance of alcohol before bed.  8. Operation of motor vehicle should be avoided when sleepy.  Signature: Electronically Signed: 08/08/21 Armanda Magic, MD; [facility]; Diplomat, American Board of Sleep Medicine

## 2021-08-10 ENCOUNTER — Telehealth: Payer: Self-pay | Admitting: *Deleted

## 2021-08-10 NOTE — Telephone Encounter (Signed)
-----   Message from Lattie Haw, RN sent at 08/08/2021 10:59 AM EST -----  ----- Message ----- From: Quintella Reichert, MD Sent: 08/07/2021   8:02 PM EST To: Reesa Chew, CMA  Please let patient know that they have sleep apnea.  Recommend therapeutic CPAP titration for treatment of patient's sleep disordered breathing.  If unable to perform an in lab titration then initiate ResMed auto CPAP from 4 to 15cm H2O with heated humidity and mask of choice and overnight pulse ox on CPAP.

## 2021-08-10 NOTE — Telephone Encounter (Signed)
Attempted to contact the patient to discuss HST results and recommendations. No answer and voice mailbox is full. Could not leave a message. If I don't reach patient today I will call once I return from vacation.

## 2021-08-21 ENCOUNTER — Telehealth (HOSPITAL_COMMUNITY): Payer: Self-pay

## 2021-08-21 ENCOUNTER — Ambulatory Visit: Payer: BC Managed Care – PPO

## 2021-08-21 DIAGNOSIS — R0683 Snoring: Secondary | ICD-10-CM

## 2021-08-21 DIAGNOSIS — I509 Heart failure, unspecified: Secondary | ICD-10-CM

## 2021-08-21 NOTE — Telephone Encounter (Signed)
Called to confirm/remind patient of their appointment at the [facility] on 08/22/21.  ° °Patient reminded to bring all medications and/or complete list. ° °Confirmed patient has transportation. Gave directions, instructed to utilize valet parking. ° °Confirmed appointment prior to ending call.  ° °

## 2021-08-22 ENCOUNTER — Other Ambulatory Visit: Payer: Self-pay

## 2021-08-22 ENCOUNTER — Ambulatory Visit (HOSPITAL_COMMUNITY)
Admission: RE | Admit: 2021-08-22 | Discharge: 2021-08-22 | Disposition: A | Discharge status: Home / Self Care | Payer: BC Managed Care – PPO | Source: Ambulatory Visit | Attending: Family Medicine | Admitting: Family Medicine

## 2021-08-22 ENCOUNTER — Encounter (HOSPITAL_COMMUNITY): Payer: Self-pay

## 2021-08-22 VITALS — BP 160/90 | HR 90 | Wt 321.6 lb

## 2021-08-22 DIAGNOSIS — I509 Heart failure, unspecified: Secondary | ICD-10-CM

## 2021-08-22 DIAGNOSIS — I110 Hypertensive heart disease with heart failure: Secondary | ICD-10-CM | POA: Insufficient documentation

## 2021-08-22 DIAGNOSIS — Z7982 Long term (current) use of aspirin: Secondary | ICD-10-CM | POA: Insufficient documentation

## 2021-08-22 DIAGNOSIS — F1010 Alcohol abuse, uncomplicated: Secondary | ICD-10-CM

## 2021-08-22 DIAGNOSIS — I252 Old myocardial infarction: Secondary | ICD-10-CM | POA: Diagnosis not present

## 2021-08-22 DIAGNOSIS — I10 Essential (primary) hypertension: Secondary | ICD-10-CM

## 2021-08-22 DIAGNOSIS — M109 Gout, unspecified: Secondary | ICD-10-CM | POA: Diagnosis not present

## 2021-08-22 DIAGNOSIS — I255 Ischemic cardiomyopathy: Secondary | ICD-10-CM | POA: Insufficient documentation

## 2021-08-22 DIAGNOSIS — E785 Hyperlipidemia, unspecified: Secondary | ICD-10-CM | POA: Diagnosis not present

## 2021-08-22 DIAGNOSIS — F1290 Cannabis use, unspecified, uncomplicated: Secondary | ICD-10-CM | POA: Insufficient documentation

## 2021-08-22 DIAGNOSIS — F1091 Alcohol use, unspecified, in remission: Secondary | ICD-10-CM | POA: Diagnosis not present

## 2021-08-22 DIAGNOSIS — Z139 Encounter for screening, unspecified: Secondary | ICD-10-CM

## 2021-08-22 DIAGNOSIS — Z79899 Other long term (current) drug therapy: Secondary | ICD-10-CM | POA: Insufficient documentation

## 2021-08-22 DIAGNOSIS — M1A0790 Idiopathic chronic gout, unspecified ankle and foot, without tophus (tophi): Secondary | ICD-10-CM | POA: Diagnosis not present

## 2021-08-22 DIAGNOSIS — I5040 Unspecified combined systolic (congestive) and diastolic (congestive) heart failure: Secondary | ICD-10-CM | POA: Insufficient documentation

## 2021-08-22 DIAGNOSIS — I160 Hypertensive urgency: Secondary | ICD-10-CM | POA: Diagnosis not present

## 2021-08-22 DIAGNOSIS — Z7984 Long term (current) use of oral hypoglycemic drugs: Secondary | ICD-10-CM | POA: Insufficient documentation

## 2021-08-22 DIAGNOSIS — M1A0720 Idiopathic chronic gout, left ankle and foot, without tophus (tophi): Secondary | ICD-10-CM

## 2021-08-22 DIAGNOSIS — I2510 Atherosclerotic heart disease of native coronary artery without angina pectoris: Secondary | ICD-10-CM

## 2021-08-22 DIAGNOSIS — G4733 Obstructive sleep apnea (adult) (pediatric): Secondary | ICD-10-CM | POA: Diagnosis not present

## 2021-08-22 DIAGNOSIS — Z9114 Patient's other noncompliance with medication regimen: Secondary | ICD-10-CM | POA: Diagnosis not present

## 2021-08-22 LAB — BASIC METABOLIC PANEL
Anion gap: 7 (ref 5–15)
BUN: 14 mg/dL (ref 6–20)
CO2: 28 mmol/L (ref 22–32)
Calcium: 9.1 mg/dL (ref 8.9–10.3)
Chloride: 104 mmol/L (ref 98–111)
Creatinine, Ser: 1.34 mg/dL — ABNORMAL HIGH (ref 0.61–1.24)
GFR, Estimated: >60 mL/min (ref >60)
Glucose, Bld: 104 mg/dL — ABNORMAL HIGH (ref 70–99)
Potassium: 4.1 mmol/L (ref 3.5–5.1)
Sodium: 139 mmol/L (ref 135–145)

## 2021-08-22 MED ORDER — ISOSORBIDE MONONITRATE ER 30 MG PO TB24: 30.0000 mg | ORAL_TABLET | Freq: Every day | ORAL | 3 refills | Status: DC

## 2021-08-22 MED ORDER — HYDRALAZINE HCL 25 MG PO TABS: 25.0000 mg | ORAL_TABLET | Freq: Three times a day (TID) | ORAL | 3 refills | Status: DC

## 2021-08-22 NOTE — Progress Notes (Addendum)
ADVANCED HF CLINIC NOTE  Primary Care: Default, Provider, MD HF Cardiologist: Dr. Shirlee LatchMcLean  HPI: Scott Dunlap is a 51 y.o. male  with a PMH significant for coronary artery disease, ischemic cardiomyopathy, hypertension and hyperlipidemia, poor medication adherence.   NSTEMI in 2017 with BMS to LAD, STEMI 2018 PTCA of LAD.  He has a history of noncompliance.   Echocardiogram in 2018 which showed an EF of 30-35%. He didn't follow up to have this repeated when ordered last year.    [facility], BP 180/120, only taking one of his meds, hctz at that time . BP meds restarted with 2 week fu arranged but pt never followed up.    Admitted 01/04/21 with new onset congestive heart failure, hypertensive urgency.  He was given IV Lasix.  Echo revealed EF 20 to 25%, grade 2 diastolic dysfunction, global hypokinesis.  Cardiology consulted.  Continued on IV Lasix, started on GDMT Coreg, Entresto, Aldactone, Farxiga. Diuresed 308lbs > 297lbs.  Discharge Cr 1.4 from 1.17 on admission, K 4.6.     [facility] and GDMT continued to be titrated. Unfortunately did not follow up with AHF as scheduled.   Today he returns for HF follow up. His left foot is swollen and he thinks he is having a gout flare; he is now wearing a boot on his left foot. He is no longer short of breath walking on flat ground. Overall feeling fine. Denies CP, dizziness, or PND/Orthopnea. Appetite ok. No fever or chills. Weight at home 314-316 pounds. Taking all medications.  BP remains 160s/120s at home. Stopped ETOH but smokes THC occasionally.Trying to eat at home more, but still eats out a lot. 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    Past Medical History:  Diagnosis Date   Hypertension    Current Outpatient Medications  Medication Sig Dispense Refill   aspirin 81 MG chewable tablet Chew 1 tablet (81 mg total) by mouth daily. 30 tablet 0   carvedilol  (COREG) 12.5 MG tablet Take 1 tablet (12.5 mg total) by mouth 2 (two) times daily with a meal. 60 tablet 3   dapagliflozin propanediol (FARXIGA) 10 MG TABS tablet Take 1 tablet (10 mg total) by mouth daily. 30 tablet 6   furosemide (LASIX) 40 MG tablet Take 1 tablet (40 mg total) by mouth daily. 30 tablet 3   rosuvastatin (CRESTOR) 40 MG tablet Take 1 tablet (40 mg total) by mouth daily. 30 tablet 6   sacubitril-valsartan (ENTRESTO) 97-103 MG Take 1 tablet by mouth 2 (two) times daily. 60 tablet 6   spironolactone (ALDACTONE) 25 MG tablet Take 1 tablet (25 mg total) by mouth daily. 30 tablet 3   No current facility-administered medications for this encounter.   No Known Allergies  Social History   Socioeconomic History   Marital status: Legally Separated    Spouse name: Not on file   Number of children: 1   Years of education: Not on file   Highest education level: Some college, no degree  Occupational History   Occupation: clamp truck Designer, television/film setoperator  Tobacco Use   Smoking status: Never   Smokeless tobacco: Never  Vaping Use   Vaping Use: Never used  Substance and Sexual Activity   Alcohol use: Yes    Alcohol/week: 25.0 standard drinks    Types: 25 Shots of liquor per week    Comment: 5 shots every other day of vodka  Drug use: Yes    Frequency: 14.0 times per week    Types: Marijuana   Sexual activity: Not on file  Other Topics Concern   Not on file  Social History Narrative   Not on file   Social Determinants of Health   Financial Resource Strain: Medium Risk   Difficulty of Paying Living Expenses: Somewhat hard  Food Insecurity: No Food Insecurity   Worried About Running Out of Food in the Last Year: Never true   Ran Out of Food in the Last Year: Never true  Transportation Needs: No Transportation Needs   Lack of Transportation (Medical): No   Lack of Transportation (Non-Medical): No  Physical Activity: Not on file  Stress: Not on file  Social Connections: Socially  Isolated   Frequency of Communication with Friends and Family: Twice a week   Frequency of Social Gatherings with Friends and Family: Never   Attends Religious Services: Never   Database administrator or Organizations: No   Attends Banker Meetings: Never   Marital Status: Separated  Intimate Partner Violence: Not on file   No family history on file.  BP (!) 160/90    Pulse 90    Wt (!) 145.9 kg (321 lb 9.6 oz)    SpO2 96%    BMI 42.43 kg/m   Wt Readings from Last 3 Encounters:  08/22/21 (!) 145.9 kg (321 lb 9.6 oz)  07/31/21 (!) 142.4 kg (314 lb)  01/30/21 (!) 141.1 kg (311 lb 2 oz)   PHYSICAL EXAM: General:  NAD. No resp difficulty HEENT: Normal Neck: Supple. No JVD. Carotids 2+ bilat; no bruits. No lymphadenopathy or thryomegaly appreciated. Cor: PMI nondisplaced. Regular rate & rhythm. No rubs, gallops or murmurs. Lungs: Clear Abdomen: Obese, nontender, nondistended. No hepatosplenomegaly. No bruits or masses. Good bowel sounds. Extremities: No cyanosis, clubbing, rash, 1+ BLE edema L>R, left ortho boot on Neuro: Alert & oriented x 3, cranial nerves grossly intact. Moves all 4 extremities w/o difficulty. Affect pleasant.  ReDs: 37%  ASSESSMENT & PLAN:  1. Combined Systolic and Diastolic CHF/ICM: Likely a strong component of poorly controlled HTN, hx poor medication adherence. EF in 2018 30-35%. - Echo (5/22): EF 20 to 25%, grade 2 diastolic dysfunction, global hypokinesis - Improved NYHA Class II, he is not volume overloaded on exam, ReDs down to 37% from previous 49%. Weight discrepancy today due to weighing with ortho boot. - Start hydralazine 25 mg tid + isosorbide mononitrate 30 mg daily. - Continue Coreg 12.5 mg bid.  - Continue Entresto 97/103 mg bid.  - Continue spiro 25 mg. BMET today. - Continue Farxiga 10 mg daily. - Continue Lasix 40 mg daily.  - Repeat echo in 3 months.   2. CAD: - NSTEMI in 2017 with BMS to LAD, STEMI 2018 PTCA of LAD - No  s/s of angina. - Continue asa + statin. - Check Lipids 6-8 weeks.  3. HTN: - Remains elevated today. - Start hydralzine + Imdur as above. - Consider renal artery doppler if unable to bring down BP. - Continue to check BP at home and log.  4. ETOH use - He has stopped since last visit. - Encouraged complete cessation.  5. Severe OSA - Sleep study AHI 31.3/hr 12/22. - Followed by Dr. Mayford Knife, no CPAP yet.  6. Gout - He has had difficulty with his left foot in the past, requiring steroid injections. - Refer back to his podiatrist, Dr. Elijah Birk.  7. SDOH - Needs PCP,  given list of local PCPs to establish care. - He was given a note today for his employer due to being out of work for his gout.  Follow up with Dr. Shirlee Latch in 12 weeks with echo.   Prince Rome, FNP-BC 08/22/21

## 2021-08-22 NOTE — Patient Instructions (Signed)
Medication Changes:  Start Hydalazine 25mg  Three times a day  Start Isorsorbide Mononitrate 30mg  Daily  Lab Work:  Labs done today, your results will be available in MyChart, we will contact you for abnormal readings.   Testing/Procedures:  Your physician has requested that you have an echocardiogram. Echocardiography is a painless test that uses sound waves to create images of your heart. It provides your doctor with information about the size and shape of your heart and how well your hearts chambers and valves are working. This procedure takes approximately one hour. There are no restrictions for this procedure.   Referrals:  Podiatry for Gout  Special Instructions // Education:  Do the following things EVERYDAY: Weigh yourself in the morning before breakfast. Write it down and keep it in a log. Take your medicines as prescribed Eat low salt foods--Limit salt (sodium) to 2000 mg per day.  Stay as active as you can everyday Limit all fluids for the day to less than 2 liters   Follow-Up in: 12 weeks with Dr. with Echocardiogram   At the [facility], you and your health needs are our priority. We have a designated team specialized in the treatment of Heart Failure. This Care Team includes your primary Heart Failure Specialized Cardiologist (physician), Advanced Practice Providers (APPs- Physician Assistants and Nurse Practitioners), and Pharmacist who all work together to provide you with the care you need, when you need it.   You may see any of the following providers on your designated Care Team at your next follow up:  Dr Dr Shirlee Latch, NP Arvilla Meres, Carron Curie [facility] [place], [facility] [place], PharmD   Please be sure to bring in all your medications bottles to every appointment.   Need to Contact [place]:  If you have any questions or concerns before your next appointment please send Karle Plumber a message  through [place] or call our office at [phone].    TO LEAVE A MESSAGE FOR THE NURSE SELECT OPTION 2, PLEASE LEAVE A MESSAGE INCLUDING: YOUR NAME DATE OF BIRTH CALL BACK NUMBER REASON FOR CALL**this is important as we prioritize the call backs  YOU WILL RECEIVE A CALL BACK THE SAME DAY AS LONG AS YOU CALL BEFORE 4:00 PM

## 2021-08-22 NOTE — Progress Notes (Signed)
ReDS Vest / Clip - 08/22/21 0900       ReDS Vest / Clip   Station Marker D    Ruler Value 39.5    ReDS Value Range Moderate volume overload    ReDS Actual Value 37

## 2021-08-23 ENCOUNTER — Other Ambulatory Visit: Payer: Self-pay

## 2021-08-23 DIAGNOSIS — I509 Heart failure, unspecified: Secondary | ICD-10-CM

## 2021-08-23 DIAGNOSIS — R0683 Snoring: Secondary | ICD-10-CM

## 2021-08-28 ENCOUNTER — Ambulatory Visit: Payer: Self-pay | Admitting: Podiatry

## 2021-08-30 ENCOUNTER — Telehealth: Payer: Self-pay | Admitting: *Deleted

## 2021-08-30 ENCOUNTER — Other Ambulatory Visit: Payer: Self-pay | Admitting: Cardiology

## 2021-08-30 DIAGNOSIS — G4733 Obstructive sleep apnea (adult) (pediatric): Secondary | ICD-10-CM

## 2021-08-30 NOTE — Telephone Encounter (Signed)
Opened in error

## 2021-08-30 NOTE — Telephone Encounter (Signed)
Patient informed of [place] results and recommendations. He agrees to proceed with Dr Remi Haggard recommendations. PA request will be sent to [facility] for titration study.

## 2021-08-30 NOTE — Telephone Encounter (Signed)
-----   Message from Lattie Haw, RN sent at 08/08/2021 10:59 AM EST -----  ----- Message ----- From: Quintella Reichert, MD Sent: 08/07/2021   8:02 PM EST To: Reesa Chew, CMA  Please let patient know that they have sleep apnea.  Recommend therapeutic CPAP titration for treatment of patient's sleep disordered breathing.  If unable to perform an in lab titration then initiate ResMed auto CPAP from 4 to 15cm H2O with heated humidity and mask of choice and overnight pulse ox on CPAP.

## 2021-09-04 ENCOUNTER — Other Ambulatory Visit: Payer: Self-pay | Admitting: Cardiovascular Disease

## 2021-09-04 DIAGNOSIS — G4733 Obstructive sleep apnea (adult) (pediatric): Secondary | ICD-10-CM

## 2021-09-12 ENCOUNTER — Telehealth: Payer: Self-pay | Admitting: *Deleted

## 2021-09-12 ENCOUNTER — Telehealth (HOSPITAL_COMMUNITY): Payer: Self-pay | Admitting: Licensed Clinical Social Worker

## 2021-09-12 NOTE — Telephone Encounter (Signed)
Pt called CSW to request help getting letter from provider explaining his inability to work during the month of December as his job was discussing termination if he could not provide.  Discussed with provider who is in agreement- letter placed at front desk for pt to pick up.  Burna Sis, LCSW Clinical Social Worker [facility] Desk#: [phone] Cell#: [phone]

## 2021-09-12 NOTE — Telephone Encounter (Signed)
Prior Authorization for CPAP titration sent to [employer] via Fax  @ [phone].

## 2021-09-14 ENCOUNTER — Telehealth (HOSPITAL_COMMUNITY): Payer: Self-pay | Admitting: *Deleted

## 2021-09-14 NOTE — Telephone Encounter (Signed)
Pt left a walk in form stating his heart rate speeds up rapidly. He gets dizzy and has to stop whatever he is doing and sit still until it stops. I called pt to get more information no answer/left vm for return call

## 2021-10-02 ENCOUNTER — Encounter (HOSPITAL_COMMUNITY): Payer: Self-pay | Admitting: *Deleted

## 2021-10-02 NOTE — Progress Notes (Signed)
Pt's FMLA forms completed to cover leave from 07/31/21 to 09/07/21, and for intermittent leave for flare-ups 1-2 times a month lasting 2 days, forms signed by Dr Shirlee Latch and faxed into [employer] at [phone], pt aware this was done

## 2021-10-04 ENCOUNTER — Telehealth: Payer: Self-pay | Admitting: *Deleted

## 2021-10-04 NOTE — Telephone Encounter (Signed)
Urgent APAP or faxed to [facility]. CPAP titration has been denied.

## 2021-10-11 ENCOUNTER — Ambulatory Visit (HOSPITAL_COMMUNITY)
Admission: EM | Admit: 2021-10-11 | Discharge: 2021-10-11 | Disposition: A | Discharge status: Home / Self Care | Payer: BC Managed Care – PPO | Attending: Internal Medicine | Admitting: Internal Medicine

## 2021-10-11 ENCOUNTER — Other Ambulatory Visit: Payer: Self-pay

## 2021-10-11 ENCOUNTER — Encounter (HOSPITAL_COMMUNITY): Payer: Self-pay | Admitting: Emergency Medicine

## 2021-10-11 DIAGNOSIS — Z659 Problem related to unspecified psychosocial circumstances: Secondary | ICD-10-CM | POA: Diagnosis not present

## 2021-10-11 DIAGNOSIS — I160 Hypertensive urgency: Secondary | ICD-10-CM

## 2021-10-11 DIAGNOSIS — F411 Generalized anxiety disorder: Secondary | ICD-10-CM

## 2021-10-11 MED ORDER — HYDROXYZINE HCL 50 MG PO TABS: 50.0000 mg | ORAL_TABLET | Freq: Three times a day (TID) | ORAL | 0 refills | Status: DC | PRN

## 2021-10-11 NOTE — ED Triage Notes (Signed)
Patient does not have a pcp.  Patient has gone to cardiovascular, but no pcp.  Patient requesting documentation to justify an emotional support dog.

## 2021-10-11 NOTE — Discharge Instructions (Signed)
We are unable to verify that your pet dog is your emotional support dog.  I will recommend you establish primary care physician for them to look further into your social support situation to help you with the situation.

## 2021-10-11 NOTE — ED Notes (Signed)
Patient is repeatedly speaking of his living arrangements with his mother, bills unpaid.

## 2021-10-13 NOTE — ED Provider Notes (Addendum)
[place]    CSN: [id number] Arrival date & time: 10/11/21  1258      History   Chief Complaint Chief Complaint  Patient presents with   Anxiety    HPI Scott Dunlap is a 51 y.o. male comes to urgent care requesting documentation request to justify him to justify a emotional support dog.Patient is going through some social stresses and hence the request. No chest pain/pressure, no headaches or abdominal pain.   HPI  Past Medical History:  Diagnosis Date   Hypertension     Patient Active Problem List   Diagnosis Date Noted   Coronary artery disease involving native coronary artery of native heart without angina pectoris 01/16/2021   Hypertensive urgency    Acute combined systolic and diastolic heart failure ([place])    Morbid obesity ([place])    S/P coronary artery stent placement    Essential hypertension 01/04/2021   CHF (congestive heart failure) ([place]) [id number]   Chronic systolic CHF (congestive heart failure), NYHA class 2 ([place]) 06/10/2017   Ischemic dilated cardiomyopathy ([place]) 06/10/2017   Dyslipidemia 06/10/2017   NSTEMI (non-ST elevated myocardial infarction) ([place]) 07/17/2016    Past Surgical History:  Procedure Laterality Date   CORONARY STENT PLACEMENT         Home Medications    Prior to Admission medications   Medication Sig Start Date End Date Taking? Authorizing Provider  hydrOXYzine (ATARAX) 50 MG tablet Take 1 tablet (50 mg total) by mouth every 8 (eight) hours as needed for anxiety. 10/11/21  Yes Ismeal Heider, Myrene Galas, MD  aspirin 81 MG chewable tablet Chew 1 tablet (81 mg total) by mouth daily. 01/09/21   Dessa Phi, DO  carvedilol (COREG) 12.5 MG tablet Take 1 tablet (12.5 mg total) by mouth 2 (two) times daily with a meal. 07/31/21   Milford, Maricela Bo, FNP  dapagliflozin propanediol (FARXIGA) 10 MG TABS tablet Take 1 tablet (10 mg total) by mouth daily. 07/31/21   Milford, Maricela Bo, FNP  furosemide (LASIX) 40 MG tablet Take 1 tablet  (40 mg total) by mouth daily. 07/31/21   Milford, Maricela Bo, FNP  hydrALAZINE (APRESOLINE) 25 MG tablet Take 1 tablet (25 mg total) by mouth 3 (three) times daily. 08/22/21   Milford, Maricela Bo, FNP  isosorbide mononitrate (IMDUR) 30 MG 24 hr tablet Take 1 tablet (30 mg total) by mouth daily. 08/22/21 11/20/21  Rafael Bihari, FNP  rosuvastatin (CRESTOR) 40 MG tablet Take 1 tablet (40 mg total) by mouth daily. 07/31/21   Milford, Maricela Bo, FNP  sacubitril-valsartan (ENTRESTO) 97-103 MG Take 1 tablet by mouth 2 (two) times daily. 07/31/21   Rafael Bihari, FNP  spironolactone (ALDACTONE) 25 MG tablet Take 1 tablet (25 mg total) by mouth daily. 07/31/21   Rafael Bihari, FNP    Family History History reviewed. No pertinent family history.  Social History Social History   Tobacco Use   Smoking status: Never   Smokeless tobacco: Never  Vaping Use   Vaping Use: Never used  Substance Use Topics   Alcohol use: Not Currently    Alcohol/week: 25.0 standard drinks    Types: 25 Shots of liquor per week    Comment: 5 shots every other day of vodka   Drug use: Not Currently    Frequency: 14.0 times per week    Types: Marijuana     Allergies   Patient has no known allergies.   Review of Systems Review of Systems As per HPI  Physical Exam Triage Vital Signs ED Triage Vitals  Enc Vitals Group     BP 10/11/21 1401 (!) 208/119     Pulse Rate 10/11/21 1401 (!) 110     Resp 10/11/21 1401 20     Temp 10/11/21 1401 98.1 F (36.7 C)     Temp Source 10/11/21 1401 Oral     SpO2 10/11/21 1401 96 %     Weight --      Height --      Head Circumference --      Peak Flow --      Pain Score 10/11/21 1357 0     Pain Loc --      Pain Edu? --      Excl. in [place]? --    No data found.  Updated Vital Signs BP (!) 183/123 (BP Location: Right Arm) Comment (BP Location): large cuff   Pulse (!) 110    Temp 98.1 F (36.7 C) (Oral)    Resp 20    SpO2 96%   Visual Acuity Right Eye  Distance:   Left Eye Distance:   Bilateral Distance:    Right Eye Near:   Left Eye Near:    Bilateral Near:     Physical Exam Vitals and nursing note reviewed.  Constitutional:      General: He is not in acute distress.    Appearance: Normal appearance. He is not ill-appearing.  Cardiovascular:     Rate and Rhythm: Normal rate and regular rhythm.  Pulmonary:     Effort: Pulmonary effort is normal.     Breath sounds: Normal breath sounds.  Musculoskeletal:        General: Normal range of motion.  Neurological:     Mental Status: He is alert.     UC Treatments / Results  Labs (all labs ordered are listed, but only abnormal results are displayed) Labs Reviewed - No data to display  EKG   Radiology No results found.  Procedures Procedures (including critical care time)  Medications Ordered in UC Medications - No data to display  Initial Impression / Assessment and Plan / UC Course  I have reviewed the triage vital signs and the nursing notes.  Pertinent labs & imaging results that were available during my care of the patient were reviewed by me and considered in my medical decision making (see chart for details).   1. Hypertensive urgency sec to social stressors: Patient advised to be compliant with medications He is encouraged to establish PCP care. Return to urgent care if any other concerns arise.  2. General Anxiety sec to stress Hydroxyzine as needed for anxiety. If symptoms are persistent, he will need further medication management. Final Clinical Impressions(s) / UC Diagnoses   Final diagnoses:  Other social stressor     Discharge Instructions      We are unable to verify that your pet dog is your emotional support dog.  I will recommend you establish primary care physician for them to look further into your social support situation to help you with the situation.   ED Prescriptions     Medication Sig Dispense Auth. Provider   hydrOXYzine  (ATARAX) 50 MG tablet Take 1 tablet (50 mg total) by mouth every 8 (eight) hours as needed for anxiety. 30 tablet Delanie Tirrell, Myrene Galas, MD      PDMP not reviewed this encounter.   Chase Picket, MD 10/13/21 [id number]    Chase Picket, MD 10/13/21 [phone]

## 2021-10-29 ENCOUNTER — Ambulatory Visit: Payer: BC Managed Care – PPO | Admitting: Medical

## 2021-11-14 ENCOUNTER — Ambulatory Visit (HOSPITAL_COMMUNITY): Admission: RE | Admit: 2021-11-14 | Payer: BC Managed Care – PPO | Source: Ambulatory Visit

## 2021-11-14 ENCOUNTER — Encounter (HOSPITAL_COMMUNITY): Payer: BC Managed Care – PPO | Admitting: Cardiology

## 2021-11-27 ENCOUNTER — Ambulatory Visit (HOSPITAL_COMMUNITY): Admission: RE | Admit: 2021-11-27 | Payer: BC Managed Care – PPO | Source: Ambulatory Visit

## 2021-12-03 ENCOUNTER — Ambulatory Visit: Payer: BC Managed Care – PPO | Admitting: Medical

## 2022-04-23 ENCOUNTER — Encounter (HOSPITAL_COMMUNITY): Payer: Self-pay | Admitting: Cardiology

## 2022-04-23 ENCOUNTER — Ambulatory Visit (HOSPITAL_COMMUNITY)
Admission: RE | Admit: 2022-04-23 | Discharge: 2022-04-23 | Disposition: A | Discharge status: Home / Self Care | Payer: Medicaid Other | Source: Ambulatory Visit | Attending: Cardiology | Admitting: Cardiology

## 2022-04-23 ENCOUNTER — Ambulatory Visit (HOSPITAL_BASED_OUTPATIENT_CLINIC_OR_DEPARTMENT_OTHER)
Admission: RE | Admit: 2022-04-23 | Discharge: 2022-04-23 | Disposition: A | Discharge status: Home / Self Care | Payer: Medicaid Other | Source: Ambulatory Visit | Attending: Family Medicine | Admitting: Family Medicine

## 2022-04-23 VITALS — BP 120/82 | HR 87

## 2022-04-23 DIAGNOSIS — E785 Hyperlipidemia, unspecified: Secondary | ICD-10-CM | POA: Insufficient documentation

## 2022-04-23 DIAGNOSIS — I5040 Unspecified combined systolic (congestive) and diastolic (congestive) heart failure: Secondary | ICD-10-CM | POA: Diagnosis not present

## 2022-04-23 DIAGNOSIS — I340 Nonrheumatic mitral (valve) insufficiency: Secondary | ICD-10-CM | POA: Diagnosis not present

## 2022-04-23 DIAGNOSIS — Z955 Presence of coronary angioplasty implant and graft: Secondary | ICD-10-CM | POA: Insufficient documentation

## 2022-04-23 DIAGNOSIS — I255 Ischemic cardiomyopathy: Secondary | ICD-10-CM | POA: Diagnosis not present

## 2022-04-23 DIAGNOSIS — G4733 Obstructive sleep apnea (adult) (pediatric): Secondary | ICD-10-CM | POA: Diagnosis not present

## 2022-04-23 DIAGNOSIS — I471 Supraventricular tachycardia: Secondary | ICD-10-CM | POA: Diagnosis not present

## 2022-04-23 DIAGNOSIS — Z7982 Long term (current) use of aspirin: Secondary | ICD-10-CM | POA: Insufficient documentation

## 2022-04-23 DIAGNOSIS — I509 Heart failure, unspecified: Secondary | ICD-10-CM

## 2022-04-23 DIAGNOSIS — I2510 Atherosclerotic heart disease of native coronary artery without angina pectoris: Secondary | ICD-10-CM | POA: Diagnosis not present

## 2022-04-23 DIAGNOSIS — I5022 Chronic systolic (congestive) heart failure: Secondary | ICD-10-CM

## 2022-04-23 DIAGNOSIS — Z8673 Personal history of transient ischemic attack (TIA), and cerebral infarction without residual deficits: Secondary | ICD-10-CM | POA: Diagnosis not present

## 2022-04-23 DIAGNOSIS — Z79899 Other long term (current) drug therapy: Secondary | ICD-10-CM | POA: Insufficient documentation

## 2022-04-23 DIAGNOSIS — I110 Hypertensive heart disease with heart failure: Secondary | ICD-10-CM | POA: Insufficient documentation

## 2022-04-23 DIAGNOSIS — I252 Old myocardial infarction: Secondary | ICD-10-CM | POA: Insufficient documentation

## 2022-04-23 LAB — BASIC METABOLIC PANEL
Anion gap: 11 (ref 5–15)
BUN: 17 mg/dL (ref 6–20)
CO2: 21 mmol/L — ABNORMAL LOW (ref 22–32)
Calcium: 9.1 mg/dL (ref 8.9–10.3)
Chloride: 104 mmol/L (ref 98–111)
Creatinine, Ser: 0.8 mg/dL (ref 0.61–1.24)
GFR, Estimated: >60 mL/min (ref >60)
Glucose, Bld: 113 mg/dL — ABNORMAL HIGH (ref 70–99)
Potassium: 4.3 mmol/L (ref 3.5–5.1)
Sodium: 136 mmol/L (ref 135–145)

## 2022-04-23 LAB — ECHOCARDIOGRAM COMPLETE
Calc EF: 41.1 %
MV M vel: 5.24 m/s
MV Peak grad: 109.8 mmHg
Radius: 0.4 cm
S' Lateral: 5 cm
Single Plane A2C EF: 38.5 %
Single Plane A4C EF: 43.1 %

## 2022-04-23 LAB — LIPID PANEL
Cholesterol: 102 mg/dL (ref 0–200)
HDL: 25 mg/dL — ABNORMAL LOW (ref >40)
LDL Cholesterol: 61 mg/dL (ref 0–99)
Total CHOL/HDL Ratio: 4.1 RATIO
Triglycerides: 79 mg/dL (ref <150)
VLDL: 16 mg/dL (ref 0–40)

## 2022-04-23 MED ORDER — METOPROLOL SUCCINATE ER 50 MG PO TB24: 50.0000 mg | ORAL_TABLET | Freq: Every day | ORAL | 3 refills | Status: AC

## 2022-04-23 MED ORDER — ENTRESTO 49-51 MG PO TABS: 1.0000 | ORAL_TABLET | Freq: Two times a day (BID) | ORAL | 11 refills | Status: AC

## 2022-04-23 MED ORDER — ISOSORBIDE MONONITRATE ER 30 MG PO TB24: 30.0000 mg | ORAL_TABLET | Freq: Every day | ORAL | 3 refills | Status: AC

## 2022-04-23 NOTE — Progress Notes (Signed)
ADVANCED HF CLINIC NOTE  Primary Care: Default, Provider, MD HF Cardiologist: Dr. Shirlee Latch  HPI: Scott Dunlap is a 51 y.o. male  with a PMH significant for coronary artery disease, ischemic cardiomyopathy, hypertension and hyperlipidemia, poor medication adherence, hx recurrent CVAS.   NSTEMI in 2017 with BMS to LAD, STEMI 2018 PTCA of LAD.  He has a history of noncompliance.   Echocardiogram in 2018 which showed an EF of 30-35%. He didn't follow up to have this repeated when ordered.   [place] [facility], BP 180/120, only taking one of his meds, hctz at that time . BP meds restarted with 2 week fu arranged but pt never followed up.    Admitted 01/04/21 with new onset congestive heart failure, hypertensive urgency.  He was given IV Lasix.  Echo revealed EF 20 to 25%, grade 2 diastolic dysfunction, global hypokinesis.  Cardiology consulted.  Continued on IV Lasix, started on GDMT. Diuresed 308lbs > 297lbs.     Seen in [facility] clinic 6/22 and GDMT further titrated. Unfortunately did not follow up with AHF as scheduled.   Last seen for f/u in January 2023. Volume okay on exam. ReDS down from 49% prior visit >>37%%. Imdur/hydralazine added d/t elevated blood pressure.   Admitted to [facility] 05/08-06/06/23 with RLE numbness. Unfortunately discovered to have CVA with multiple small acute infarcts on MRI suggestive of embolic etiology. Noncompliance with medications noted. Had recurrent stroke symptoms throughout his hospital stay. Echo with EF 25-30%, LV apex appeared trabeculated (cannot rule out thrombus vs partial noncompaction). He was started on anticoagulation with Eliquis but later switched to Warfarin with lovenox bridge d/t recurrent stroke symptoms. Course further c/b runs of SVT requiring adenosine to terminate. Started on amiodarone to suppress arrhythmias. He was discharged with PEG tube.  Readmitted 07/08-07/16/23 with lower GI bleed and UTI. EGD and  colonoscopy unrevealing. Treated for UTI. Subsequently developed C.diff diarrhea and treated with vancomycin.  Admitted to [facility] 07/26-08/10/23 with altered mental status. Found to have large basal ganglia IPH with IVH and mild hydrocephalus. This was fe/t to be secondary to hypertension while on warfarin. EVD was placed. Echo during admit with EF 50-55%. It was recommended to hold warfarin for at least 4-6 weeks and f/u with cardiology.  He saw Neurology for f/u  04/16/22. It was felt that he could resume anticoagulation if cardiology felt necessary.   He is here today for hospital f/u. He arrived on a stretcher via EMS. His daughter is present and assists with the history. He has been receiving care at [facility] in [facility] since discharge. He is nonverbal. Patient makes eye contact but unclear how much he comprehends. Does not follow commands.   His daughter reports PT/OT had been stopped soon after discharge d/t lack of improvement after a couple of sessions. Recently referred to resume PT/OT/ST by Neurology. Receiving all medications at SNF.   Echo today EF 40-45%, moderate LVH, RV okay, moderate eccentric anteriorly-directed MR   Past Medical History:  Diagnosis Date   Hypertension    Current Outpatient Medications  Medication Sig Dispense Refill   acetaminophen (TYLENOL) 325 MG tablet Take 650 mg by mouth every 6 (six) hours as needed.     amiodarone (PACERONE) 200 MG tablet 200 mg by Per J Tube route daily.     aspirin 81 MG chewable tablet Chew 1 tablet (81 mg total) by mouth daily. 30 tablet 0   atorvastatin (  LIPITOR) 80 MG tablet 80 mg by Per J Tube route daily.     ezetimibe (ZETIA) 10 MG tablet Place 10 mg into feeding tube daily.     famotidine (PEPCID) 20 MG tablet Place 20 mg into feeding tube 2 (two) times daily.     hydrALAZINE (APRESOLINE) 25 MG tablet Take 1 tablet (25 mg total) by mouth 3 (three) times daily. 260 tablet 3   isosorbide  mononitrate (IMDUR) 30 MG 24 hr tablet Take 1 tablet (30 mg total) by mouth daily. 90 tablet 3   metoprolol succinate (TOPROL-XL) 50 MG 24 hr tablet Take 1 tablet (50 mg total) by mouth daily. Take with or immediately following a meal. 90 tablet 3   sacubitril-valsartan (ENTRESTO) 49-51 MG Take 1 tablet by mouth 2 (two) times daily. 60 tablet 11   thiamine (VITAMIN B1) 100 MG tablet 100 mg by Per J Tube route daily.     No current facility-administered medications for this encounter.   No Known Allergies  Social History   Socioeconomic History   Marital status: Legally Separated    Spouse name: Not on file   Number of children: 1   Years of education: Not on file   Highest education level: Some college, no degree  Occupational History   Occupation: clamp truck Mining engineer  Tobacco Use   Smoking status: Never   Smokeless tobacco: Never  Vaping Use   Vaping Use: Never used  Substance and Sexual Activity   Alcohol use: Not Currently    Alcohol/week: 25.0 standard drinks of alcohol    Types: 25 Shots of liquor per week    Comment: 5 shots every other day of vodka   Drug use: Not Currently    Frequency: 14.0 times per week    Types: Marijuana   Sexual activity: Not on file  Other Topics Concern   Not on file  Social History Narrative   Not on file   Social Determinants of Health   Financial Resource Strain: Medium Risk (01/30/2021)   Overall Financial Resource Strain (CARDIA)    Difficulty of Paying Living Expenses: Somewhat hard  Food Insecurity: No Food Insecurity (01/05/2021)   Hunger Vital Sign    Worried About Running Out of Food in the Last Year: Never true    Ran Out of Food in the Last Year: Never true  Transportation Needs: No Transportation Needs (01/05/2021)   PRAPARE - Hydrologist (Medical): No    Lack of Transportation (Non-Medical): No  Physical Activity: Not on file  Stress: Not on file  Social Connections: Socially Isolated  (01/05/2021)   Social Connection and Isolation Panel [NHANES]    Frequency of Communication with Friends and Family: Twice a week    Frequency of Social Gatherings with Friends and Family: Never    Attends Religious Services: Never    Marine scientist or Organizations: No    Attends Archivist Meetings: Never    Marital Status: Separated  Intimate Partner Violence: Not on file   History reviewed. No pertinent family history.  BP 120/82   Pulse 87   SpO2 98%   Wt Readings from Last 3 Encounters:  08/22/21 (!) 145.9 kg (321 lb 9.6 oz)  07/31/21 (!) 142.4 kg (314 lb)  01/30/21 (!) 141.1 kg (311 lb 2 oz)   PHYSICAL EXAM: General:  chronically ill appearing. Lying on stretcher. HEENT: normal Neck: supple. no JVD. Carotids 2+ bilat; no bruits.  Cor: PMI  nondisplaced. Regular rate & rhythm. No rubs, gallops, 3/6 MR murmur Lungs: clear Abdomen: soft, nontender, nondistended.  Extremities: no cyanosis, clubbing, rash, edema Neuro: Awake. Makes eye contact. Does not follow commands. RUE appears contracted.   ASSESSMENT & PLAN:  1. Combined Systolic and Diastolic CHF/ICM: Likely a strong component of poorly controlled HTN, hx poor medication adherence.  - EF in 2018 30-35%. - Echo (5/22): EF 20 to 25%, grade 2 diastolic dysfunction, global hypokinesis - Echo at [facility] (05/23): EF 25-30%, LV apex trabeculated unable to r/o thrombus - Echo [facility] (08/23): EF 50-55% - Echo today: EF 40-45%, moderate LVH, RV okay, moderate eccentric anteriorly-directed MR - Unable to assess NYHA status. Volume okay on exam. Not currently requiring loop diuretic. - Stop amlodipine to allow room to titrate GDMT - Increase entresto to 49/51 mg BID - Switch metoprolol tartrate 50 BID to metoprolol succinate 50 mg daily - Continue hydralazine 25 mg TID - Add imdur 30 mg daily - BMET today and in 1 week   2. CAD: - NSTEMI in 2017 with BMS to LAD, STEMI 2018 PTCA of LAD - No s/s of  angina. - Continue asa + statin. - Lipid panel today  3. HTN: - BP stable today - Meds as above  4. ETOH use - He has quit using  5. Severe OSA - Sleep study AHI 31.3/hr 12/22. - Followed by Dr. Mayford Knife, doubt he could tolerate CPAP currently  6. Hx CVA - ischemic strokes 05/23, felt to be cardioembolic and anticoagualted with warfarin. Unable to exclude LV thrombus on echo at that time, EF was 25-30% - left basal ganglia ICH with IVH 2/2 hypertension in setting of warfarin 07/23 - No known documentation of atrial fibrillation. SR on ECG today.  - Follows with Neuro - Given improvement in LV function, would leave off warfarin for now. Continue aspirin and statin - Encouraged aggressive PT/OT/ST  7. Mitral regurgitation - Moderate in severity on echo today - If he makes significant recovery over next few months, would need to consider TEE at some point to better assess severity of MR. Not stable enough currently.  8. SVT - Multiple episodes during admission in May 2023, terminated with adenosine - Continue amiodarone 200 mg daily. TSH normal in May. - SR on ECG today  Follow up: 6 weeks with APP  Anna Genre, PA-C 04/23/22  Patient seen with PA, agree with the above note.    History reviewed above.  He comes in by stretcher today. Awake but not interactive and does not follow commands.    Echo from today was reviewed, EF improved 40-45% with moderate LVH, normal RV, PASP 35 mmHg, at least moderate eccentric MR.   General: NAD Neck: No JVD, no thyromegaly or thyroid nodule.  Lungs: Clear to auscultation bilaterally with normal respiratory effort. CV: Nondisplaced PMI.  Heart regular S1/S2, no S3/S4, 3/6 HSM apex.  No peripheral edema.  No carotid bruit.  Normal pedal pulses.  Abdomen: Soft, nontender, no hepatosplenomegaly, no distention.  Skin: Intact without lesions or rashes.  Neurologic: Awake, not interactive and does not follow commands.  Extremities: No  clubbing or cyanosis.  HEENT: Normal.   Echo shows some improvement in LV systolic function but still low at 40-45%.  Will make adjustments in his med regimen to reflect cardiomyopathy.  - Stop metoprolol tartrate, start Toprol XL 50 mg daily.  - Stop amlodipine.  - Increase Entresto to 49/51 bid.  BMET today and in 10 days.  -  Continue hydralazine and add Imdur 30 mg daily.   He continues on amiodarone for SVT, check LFTs/TSH regularly.  Would decrease amiodarone dose if no further SVT runs.   He has at least moderate eccentric MR by echo, may be severe.  Murmur is loud.   - If he makes functional improvement, would arrange for TEE to assess mitral valve.   With EF up to 40-45%, I think he can stay off anticoagulation. This was initially started due to ischemic strokes then stopped because of intracerebral hemorrhage in 7/23.   Followup in 6 wks with APP.   Loralie Champagne 04/23/2022

## 2022-04-23 NOTE — Patient Instructions (Signed)
Stop Amlodipine  Stop Metoprolol Tartrate  Start Metoprolol Succinate 50mg  daily.  Increase Entresto to 49/51 Twice daily  Labs done today, your results will be available in MyChart, we will contact you for abnormal readings.  Repeat blood work in 1 week  Your physician recommends that you schedule a follow-up appointment in: 6 weeks  If you have any questions or concerns before your next appointment please send a message through [place] or call our office at [phone].    TO LEAVE A MESSAGE FOR THE NURSE SELECT OPTION 2, PLEASE LEAVE A MESSAGE INCLUDING: YOUR NAME DATE OF BIRTH CALL BACK NUMBER REASON FOR CALL**this is important as we prioritize the call backs  YOU WILL RECEIVE A CALL BACK THE SAME DAY AS LONG AS YOU CALL BEFORE 4:00 PM  At the [facility], you and your health needs are our priority. As part of our continuing mission to provide you with exceptional heart care, we have created designated Provider Care Teams. These Care Teams include your primary Cardiologist (physician) and Advanced Practice Providers (APPs- Physician Assistants and Nurse Practitioners) who all work together to provide you with the care you need, when you need it.   You may see any of the following providers on your designated Care Team at your next follow up: Dr [phone] Dr Arvilla Meres Dr. Marca Ancona, NP Marcos Eke, Robbie Lis [facility] [place], [place], NP Brynda Peon, PharmD   Please be sure to bring in all your medications bottles to every appointment.

## 2022-04-23 NOTE — Progress Notes (Signed)
  Echocardiogram 2D Echocardiogram has been performed.  Scott Dunlap 04/23/2022, 2:03 PM

## 2022-05-14 ENCOUNTER — Other Ambulatory Visit (HOSPITAL_COMMUNITY): Payer: BC Managed Care – PPO

## 2022-05-14 ENCOUNTER — Encounter (HOSPITAL_COMMUNITY): Payer: BC Managed Care – PPO | Admitting: Cardiology

## 2022-06-03 ENCOUNTER — Telehealth (HOSPITAL_COMMUNITY): Payer: Self-pay

## 2022-06-03 NOTE — Progress Notes (Signed)
ADVANCED HF CLINIC NOTE  Primary Care: Default, Provider, MD HF Cardiologist: Dr. Aundra Dubin  HPI: Scott Dunlap is a 51 y.o. male  with a PMH significant for coronary artery disease, ischemic cardiomyopathy, hypertension and hyperlipidemia, poor medication adherence, hx recurrent CVAS.   NSTEMI in 2017 with BMS to LAD, STEMI 2018 PTCA of LAD.  He has a history of noncompliance.   Echocardiogram in 2018 which showed an EF of 30-35%. He didn't follow up to have this repeated when ordered.   [place] [facility] [facility], BP 180/120, only taking one of his meds, hctz at that time . BP meds restarted with 2 week fu arranged but pt never followed up.    Admitted 01/04/21 with new onset congestive heart failure, hypertensive urgency.  He was given IV Lasix.  Echo revealed EF 20 to [id number], grade 2 diastolic dysfunction, global hypokinesis.  Cardiology consulted.  Continued on IV Lasix, started on GDMT. Diuresed 308lbs > 297lbs.     Seen in [facility] clinic 6/22 and GDMT further titrated. Unfortunately did not follow up with AHF as scheduled.   Last seen for f/u in January 2023. Volume okay on exam. ReDS down from 49% prior visit >>37%%. Imdur/hydralazine added d/t elevated blood pressure.   Admitted to [facility] 05/08-06/06/23 with RLE numbness. Unfortunately discovered to have CVA with multiple small acute infarcts on MRI suggestive of embolic etiology. Noncompliance with medications noted. Had recurrent stroke symptoms throughout his hospital stay. Echo with EF 25-30%, LV apex appeared trabeculated (cannot rule out thrombus vs partial noncompaction). He was started on anticoagulation with Eliquis but later switched to Warfarin with lovenox bridge d/t recurrent stroke symptoms. Course further c/b runs of SVT requiring adenosine to terminate. Started on amiodarone to suppress arrhythmias. He was discharged with PEG tube.  Readmitted 07/08-07/16/23 with lower GI bleed and UTI. EGD and  colonoscopy unrevealing. Treated for UTI. Subsequently developed C.diff diarrhea and treated with vancomycin.  Admitted to [facility] 07/26-08/10/23 with altered mental status. Found to have large basal ganglia IPH with IVH and mild hydrocephalus. This was fe/t to be secondary to hypertension while on warfarin. EVD was placed. Echo during admit with EF 50-55%. It was recommended to hold warfarin for at least 4-6 weeks and f/u with cardiology.  He saw Neurology for f/u  04/16/22. It was felt that he could resume anticoagulation if cardiology felt necessary.   He is here today for hospital f/u. He arrived on a stretcher via EMS. His daughter is present and assists with the history. He has been receiving care at [facility] in [facility] since discharge. He is nonverbal. Patient makes eye contact but unclear how much he comprehends. Does not follow commands.   His daughter reports PT/OT had been stopped soon after discharge d/t lack of improvement after a couple of sessions. Recently referred to resume PT/OT/ST by Neurology. Receiving all medications at SNF.   Echo today EF 40-45%, moderate LVH, RV okay, moderate eccentric anteriorly-directed MR   Past Medical History:  Diagnosis Date   Hypertension    Current Outpatient Medications  Medication Sig Dispense Refill   acetaminophen (TYLENOL) 325 MG tablet Take 650 mg by mouth every 6 (six) hours as needed.     amiodarone (PACERONE) 200 MG tablet 200 mg by Per J Tube route daily.     aspirin 81 MG chewable tablet Chew 1 tablet (81 mg total) by mouth daily. 30 tablet 0   atorvastatin (  LIPITOR) 80 MG tablet 80 mg by Per J Tube route daily.     ezetimibe (ZETIA) 10 MG tablet Place 10 mg into feeding tube daily.     famotidine (PEPCID) 20 MG tablet Place 20 mg into feeding tube 2 (two) times daily.     hydrALAZINE (APRESOLINE) 25 MG tablet Take 1 tablet (25 mg total) by mouth 3 (three) times daily. 260 tablet 3   isosorbide  mononitrate (IMDUR) 30 MG 24 hr tablet Take 1 tablet (30 mg total) by mouth daily. 90 tablet 3   metoprolol succinate (TOPROL-XL) 50 MG 24 hr tablet Take 1 tablet (50 mg total) by mouth daily. Take with or immediately following a meal. 90 tablet 3   sacubitril-valsartan (ENTRESTO) 49-51 MG Take 1 tablet by mouth 2 (two) times daily. 60 tablet 11   thiamine (VITAMIN B1) 100 MG tablet 100 mg by Per J Tube route daily.     No current facility-administered medications for this visit.   No Known Allergies  Social History   Socioeconomic History   Marital status: Legally Separated    Spouse name: Not on file   Number of children: 1   Years of education: Not on file   Highest education level: Some college, no degree  Occupational History   Occupation: clamp truck Mining engineer  Tobacco Use   Smoking status: Never   Smokeless tobacco: Never  Vaping Use   Vaping Use: Never used  Substance and Sexual Activity   Alcohol use: Not Currently    Alcohol/week: 25.0 standard drinks of alcohol    Types: 25 Shots of liquor per week    Comment: 5 shots every other day of vodka   Drug use: Not Currently    Frequency: 14.0 times per week    Types: Marijuana   Sexual activity: Not on file  Other Topics Concern   Not on file  Social History Narrative   Not on file   Social Determinants of Health   Financial Resource Strain: Medium Risk (01/30/2021)   Overall Financial Resource Strain (CARDIA)    Difficulty of Paying Living Expenses: Somewhat hard  Food Insecurity: No Food Insecurity (01/05/2021)   Hunger Vital Sign    Worried About Running Out of Food in the Last Year: Never true    Ran Out of Food in the Last Year: Never true  Transportation Needs: No Transportation Needs (01/05/2021)   PRAPARE - Hydrologist (Medical): No    Lack of Transportation (Non-Medical): No  Physical Activity: Not on file  Stress: Not on file  Social Connections: Socially Isolated  (01/05/2021)   Social Connection and Isolation Panel [NHANES]    Frequency of Communication with Friends and Family: Twice a week    Frequency of Social Gatherings with Friends and Family: Never    Attends Religious Services: Never    Marine scientist or Organizations: No    Attends Archivist Meetings: Never    Marital Status: Separated  Intimate Partner Violence: Not on file   No family history on file.  There were no vitals taken for this visit.  Wt Readings from Last 3 Encounters:  08/22/21 (!) 145.9 kg (321 lb 9.6 oz)  07/31/21 (!) 142.4 kg (314 lb)  01/30/21 (!) 141.1 kg (311 lb 2 oz)   PHYSICAL EXAM: General:  chronically ill appearing. Lying on stretcher. HEENT: normal Neck: supple. no JVD. Carotids 2+ bilat; no bruits.  Cor: PMI nondisplaced. Regular rate &  rhythm. No rubs, gallops, 3/6 MR murmur Lungs: clear Abdomen: soft, nontender, nondistended.  Extremities: no cyanosis, clubbing, rash, edema Neuro: Awake. Makes eye contact. Does not follow commands. RUE appears contracted.   ASSESSMENT & PLAN:  1. Combined Systolic and Diastolic CHF/ICM: Likely a strong component of poorly controlled HTN, hx poor medication adherence.  - EF in 2018 30-35%. - Echo (5/22): EF 20 to 16%, grade 2 diastolic dysfunction, global hypokinesis - Echo at [facility] (05/23): EF 25-30%, LV apex trabeculated unable to r/o thrombus - Echo [facility] (08/23): EF 50-55% - Echo today: EF 40-45%, moderate LVH, RV okay, moderate eccentric anteriorly-directed MR - Unable to assess NYHA status. Volume okay on exam. Not currently requiring loop diuretic. - Stop amlodipine to allow room to titrate GDMT - Increase entresto to 49/51 mg BID - Switch metoprolol tartrate 50 BID to metoprolol succinate 50 mg daily - Continue hydralazine 25 mg TID - Add imdur 30 mg daily - BMET today and in 1 week   2. CAD: - NSTEMI in 2017 with BMS to LAD, STEMI 2018 PTCA of LAD - No s/s of angina. -  Continue asa + statin. - Lipid panel today  3. HTN: - BP stable today - Meds as above  4. ETOH use - He has quit using  5. Severe OSA - Sleep study AHI 31.3/hr 12/22. - Followed by Dr. Radford Pax, doubt he could tolerate CPAP currently  6. Hx CVA - ischemic strokes 05/23, felt to be cardioembolic and anticoagualted with warfarin. Unable to exclude LV thrombus on echo at that time, EF was 25-30% - left basal ganglia ICH with IVH 2/2 hypertension in setting of warfarin 07/23 - No known documentation of atrial fibrillation. SR on ECG today.  - Follows with Neuro - Given improvement in LV function, would leave off warfarin for now. Continue aspirin and statin - Encouraged aggressive PT/OT/ST  7. Mitral regurgitation - Moderate in severity on echo today - If he makes significant recovery over next few months, would need to consider TEE at some point to better assess severity of MR. Not stable enough currently.  8. SVT - Multiple episodes during admission in May 2023, terminated with adenosine - Continue amiodarone 200 mg daily. TSH normal in May. - SR on ECG today  Follow up: 6 weeks with APP  Marlyce Huge, PA-C 06/03/22  Patient seen with PA, agree with the above note.    History reviewed above.  He comes in by stretcher today. Awake but not interactive and does not follow commands.    Echo from today was reviewed, EF improved 40-45% with moderate LVH, normal RV, PASP 35 mmHg, at least moderate eccentric MR.   General: NAD Neck: No JVD, no thyromegaly or thyroid nodule.  Lungs: Clear to auscultation bilaterally with normal respiratory effort. CV: Nondisplaced PMI.  Heart regular S1/S2, no S3/S4, 3/6 HSM apex.  No peripheral edema.  No carotid bruit.  Normal pedal pulses.  Abdomen: Soft, nontender, no hepatosplenomegaly, no distention.  Skin: Intact without lesions or rashes.  Neurologic: Awake, not interactive and does not follow commands.  Extremities: No clubbing or  cyanosis.  HEENT: Normal.   Echo shows some improvement in LV systolic function but still low at 40-45%.  Will make adjustments in his med regimen to reflect cardiomyopathy.  - Stop metoprolol tartrate, start Toprol XL 50 mg daily.  - Stop amlodipine.  - Increase Entresto to 49/51 bid.  BMET today and in 10 days.  - Continue hydralazine and  add Imdur 30 mg daily.   He continues on amiodarone for SVT, check LFTs/TSH regularly.  Would decrease amiodarone dose if no further SVT runs.   He has at least moderate eccentric MR by echo, may be severe.  Murmur is loud.   - If he makes functional improvement, would arrange for TEE to assess mitral valve.   With EF up to 40-45%, I think he can stay off anticoagulation. This was initially started due to ischemic strokes then stopped because of intracerebral hemorrhage in 7/23.   Followup in 6 wks with APP.   [place] 06/03/2022

## 2022-06-03 NOTE — Telephone Encounter (Signed)
Called and was unable to leave a voice message  to confirm/remind patient of their appointment at the [facility] on 06/04/22.   Patient reminded to bring all medications and/or complete list.  Confirmed patient has transportation. Gave directions, instructed to utilize [facility].  Confirmed appointment prior to ending call.

## 2022-06-04 ENCOUNTER — Encounter (HOSPITAL_COMMUNITY): Payer: Self-pay

## 2022-06-04 ENCOUNTER — Ambulatory Visit (HOSPITAL_COMMUNITY)
Admission: RE | Admit: 2022-06-04 | Discharge: 2022-06-04 | Disposition: A | Discharge status: Home / Self Care | Payer: Medicaid Other | Source: Ambulatory Visit | Attending: Family Medicine | Admitting: Family Medicine

## 2022-06-04 VITALS — BP 130/76 | HR 70 | Wt 238.0 lb

## 2022-06-04 DIAGNOSIS — I110 Hypertensive heart disease with heart failure: Secondary | ICD-10-CM | POA: Diagnosis not present

## 2022-06-04 DIAGNOSIS — Z7982 Long term (current) use of aspirin: Secondary | ICD-10-CM | POA: Insufficient documentation

## 2022-06-04 DIAGNOSIS — I255 Ischemic cardiomyopathy: Secondary | ICD-10-CM | POA: Insufficient documentation

## 2022-06-04 DIAGNOSIS — I10 Essential (primary) hypertension: Secondary | ICD-10-CM

## 2022-06-04 DIAGNOSIS — I340 Nonrheumatic mitral (valve) insufficiency: Secondary | ICD-10-CM | POA: Diagnosis not present

## 2022-06-04 DIAGNOSIS — I5022 Chronic systolic (congestive) heart failure: Secondary | ICD-10-CM

## 2022-06-04 DIAGNOSIS — I252 Old myocardial infarction: Secondary | ICD-10-CM | POA: Diagnosis not present

## 2022-06-04 DIAGNOSIS — E785 Hyperlipidemia, unspecified: Secondary | ICD-10-CM | POA: Diagnosis not present

## 2022-06-04 DIAGNOSIS — I5040 Unspecified combined systolic (congestive) and diastolic (congestive) heart failure: Secondary | ICD-10-CM | POA: Insufficient documentation

## 2022-06-04 DIAGNOSIS — Z8673 Personal history of transient ischemic attack (TIA), and cerebral infarction without residual deficits: Secondary | ICD-10-CM | POA: Diagnosis not present

## 2022-06-04 DIAGNOSIS — Z91148 Patient's other noncompliance with medication regimen for other reason: Secondary | ICD-10-CM | POA: Insufficient documentation

## 2022-06-04 DIAGNOSIS — G4733 Obstructive sleep apnea (adult) (pediatric): Secondary | ICD-10-CM

## 2022-06-04 DIAGNOSIS — I471 Supraventricular tachycardia, unspecified: Secondary | ICD-10-CM | POA: Insufficient documentation

## 2022-06-04 DIAGNOSIS — Z79899 Other long term (current) drug therapy: Secondary | ICD-10-CM | POA: Diagnosis not present

## 2022-06-04 DIAGNOSIS — I639 Cerebral infarction, unspecified: Secondary | ICD-10-CM

## 2022-06-04 DIAGNOSIS — I2510 Atherosclerotic heart disease of native coronary artery without angina pectoris: Secondary | ICD-10-CM | POA: Diagnosis not present

## 2022-06-04 DIAGNOSIS — F1010 Alcohol abuse, uncomplicated: Secondary | ICD-10-CM

## 2022-06-04 LAB — COMPREHENSIVE METABOLIC PANEL
ALT: 17 U/L (ref 0–44)
AST: 15 U/L (ref 15–41)
Albumin: 2.9 g/dL — ABNORMAL LOW (ref 3.5–5.0)
Alkaline Phosphatase: 64 U/L (ref 38–126)
Anion gap: 7 (ref 5–15)
BUN: 21 mg/dL — ABNORMAL HIGH (ref 6–20)
CO2: 24 mmol/L (ref 22–32)
Calcium: 9.1 mg/dL (ref 8.9–10.3)
Chloride: 108 mmol/L (ref 98–111)
Creatinine, Ser: 0.93 mg/dL (ref 0.61–1.24)
GFR, Estimated: >60 mL/min (ref >60)
Glucose, Bld: 98 mg/dL (ref 70–99)
Potassium: 3.9 mmol/L (ref 3.5–5.1)
Sodium: 139 mmol/L (ref 135–145)
Total Bilirubin: 0.4 mg/dL (ref 0.3–1.2)
Total Protein: 7.1 g/dL (ref 6.5–8.1)

## 2022-06-04 LAB — TSH: TSH: 1.46 u[IU]/mL (ref 0.350–4.500)

## 2022-06-04 LAB — CBC
HCT: 37.2 % — ABNORMAL LOW (ref 39.0–52.0)
Hemoglobin: 12.1 g/dL — ABNORMAL LOW (ref 13.0–17.0)
MCH: 29.7 pg (ref 26.0–34.0)
MCHC: 32.5 g/dL (ref 30.0–36.0)
MCV: 91.2 fL (ref 80.0–100.0)
Platelets: 441 10*3/uL — ABNORMAL HIGH (ref 150–400)
RBC: 4.08 MIL/uL — ABNORMAL LOW (ref 4.22–5.81)
RDW: 13.6 % (ref 11.5–15.5)
WBC: 6.6 10*3/uL (ref 4.0–10.5)
nRBC: 0 % (ref 0.0–0.2)

## 2022-06-04 MED ORDER — AMIODARONE HCL 100 MG PO TABS: 100.0000 mg | ORAL_TABLET | Freq: Every day | ORAL | 6 refills | Status: AC

## 2022-06-04 MED ORDER — HYDRALAZINE HCL 25 MG PO TABS: 25.0000 mg | ORAL_TABLET | Freq: Three times a day (TID) | ORAL | 3 refills | Status: AC

## 2022-06-04 NOTE — Patient Instructions (Signed)
The orders below were given to patient  via SNF orders ([facility])  DECREASE Amiodarone to 100 mg one tab daily via J tube DECREASE Hydralazine 25 mg one tab three times daily via J tube    Labs today We will only contact you if something comes back abnormal or we need to make some changes. Otherwise no news is good news!   Your physician recommends that you schedule a follow-up appointment in: 6 months with Dr Aundra Dubin  Please call in April 2024 for appointment   Do the following things EVERYDAY: Weigh yourself in the morning before breakfast. Write it down and keep it in a log. Take your medicines as prescribed Eat low salt foods--Limit salt (sodium) to 2000 mg per day.  Stay as active as you can everyday Limit all fluids for the day to less than 2 liters

## 2022-06-05 IMAGING — DX DG CHEST 2V
1 series · 1 of 1 positions shown · non-contrast
Comparison: May 16, 2017

CLINICAL DATA: Shortness of breath

EXAM:
CHEST - 2 VIEW

[chest lat]
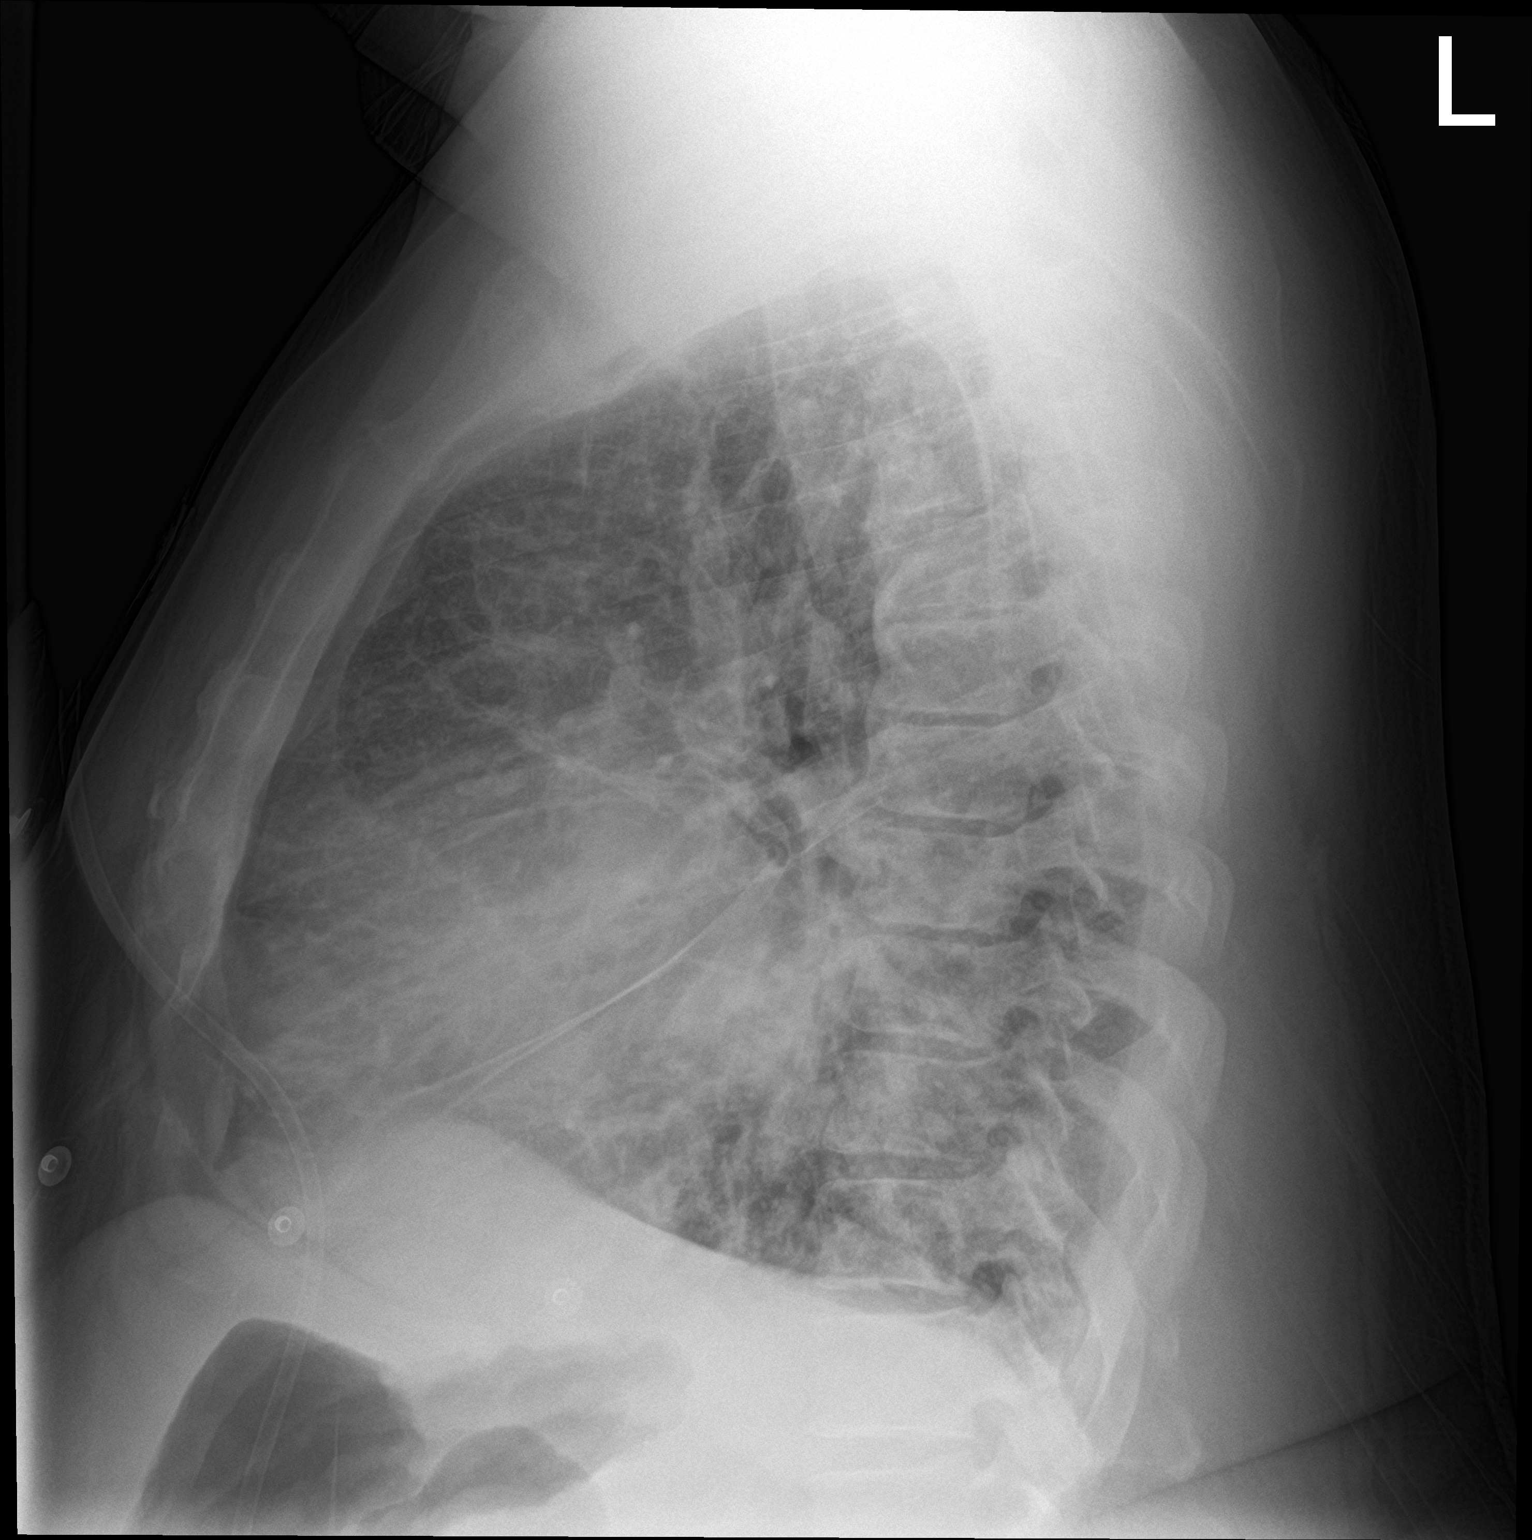

[1 of 1 positions shown; findings below may reference images not displayed]

FINDINGS: There is cardiomegaly with pulmonary venous hypertension. There is
interstitial edema. There is mild left midlung atelectasis. No
consolidation. No adenopathy. No bone lesions.
IMPRESSION: Cardiomegaly with pulmonary vascular congestion. Interstitial edema.
Appearance indicative of congestive heart failure. No consolidation.
Mild left midlung atelectasis.
